# Patient Record
Sex: Female | Born: 1990 | Race: White | Hispanic: No | Marital: Married | Smoking: Never smoker
Health system: Southern US, Community
[De-identification: ages and names within clinical notes are randomized; demographics above are authoritative.]

## PROBLEM LIST (undated history)

## (undated) DIAGNOSIS — T7840XA Allergy, unspecified, initial encounter: Secondary | ICD-10-CM

## (undated) DIAGNOSIS — F32A Depression, unspecified: Secondary | ICD-10-CM

## (undated) DIAGNOSIS — E282 Polycystic ovarian syndrome: Secondary | ICD-10-CM

## (undated) DIAGNOSIS — F329 Major depressive disorder, single episode, unspecified: Secondary | ICD-10-CM

## (undated) HISTORY — DX: Depression, unspecified: F32.A

## (undated) HISTORY — DX: Polycystic ovarian syndrome: E28.2

## (undated) HISTORY — DX: Allergy, unspecified, initial encounter: T78.40XA

## (undated) HISTORY — PX: NASAL SINUS SURGERY: SHX719

---
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
## 2012-10-31 ENCOUNTER — Encounter (HOSPITAL_BASED_OUTPATIENT_CLINIC_OR_DEPARTMENT_OTHER): Payer: Self-pay | Admitting: Emergency Medicine

## 2012-10-31 ENCOUNTER — Emergency Department (HOSPITAL_BASED_OUTPATIENT_CLINIC_OR_DEPARTMENT_OTHER)
Admission: EM | Admit: 2012-10-31 | Discharge: 2012-10-31 | Disposition: A | Discharge status: Home / Self Care | Payer: Managed Care, Other (non HMO) | Attending: Emergency Medicine | Admitting: Emergency Medicine

## 2012-10-31 DIAGNOSIS — R51 Headache: Secondary | ICD-10-CM | POA: Insufficient documentation

## 2012-10-31 DIAGNOSIS — Z792 Long term (current) use of antibiotics: Secondary | ICD-10-CM | POA: Insufficient documentation

## 2012-10-31 DIAGNOSIS — Z79899 Other long term (current) drug therapy: Secondary | ICD-10-CM | POA: Insufficient documentation

## 2012-10-31 DIAGNOSIS — R519 Headache, unspecified: Secondary | ICD-10-CM

## 2012-10-31 DIAGNOSIS — J3489 Other specified disorders of nose and nasal sinuses: Secondary | ICD-10-CM | POA: Insufficient documentation

## 2012-10-31 DIAGNOSIS — M79609 Pain in unspecified limb: Secondary | ICD-10-CM | POA: Insufficient documentation

## 2012-10-31 DIAGNOSIS — H53149 Visual discomfort, unspecified: Secondary | ICD-10-CM | POA: Insufficient documentation

## 2012-10-31 DIAGNOSIS — M542 Cervicalgia: Secondary | ICD-10-CM | POA: Insufficient documentation

## 2012-10-31 MED ORDER — MOMETASONE FUROATE 50 MCG/ACT NA SUSP: 2.0000 | Freq: Every day | NASAL | Status: DC

## 2012-10-31 MED ORDER — DIPHENHYDRAMINE HCL 25 MG PO CAPS
25.0000 mg | ORAL_CAPSULE | Freq: Once | ORAL | Status: AC
  Administered 2012-10-31: 25 mg via ORAL
  Filled 2012-10-31: qty 1

## 2012-10-31 MED ORDER — OXYMETAZOLINE HCL 0.05 % NA SOLN: 2.0000 | Freq: Two times a day (BID) | NASAL | Status: DC

## 2012-10-31 MED ORDER — IBUPROFEN 400 MG PO TABS
400.0000 mg | ORAL_TABLET | Freq: Four times a day (QID) | ORAL | Status: DC | PRN
Start: 2012-10-31 — End: 2013-10-29

## 2012-10-31 MED ORDER — METOCLOPRAMIDE HCL 10 MG PO TABS
10.0000 mg | ORAL_TABLET | Freq: Once | ORAL | Status: AC
  Administered 2012-10-31: 10 mg via ORAL
  Filled 2012-10-31: qty 1

## 2012-10-31 MED ORDER — KETOROLAC TROMETHAMINE 60 MG/2ML IM SOLN
60.0000 mg | Freq: Once | INTRAMUSCULAR | Status: AC
  Administered 2012-10-31: 60 mg via INTRAMUSCULAR
  Filled 2012-10-31: qty 2

## 2012-10-31 MED ORDER — METOCLOPRAMIDE HCL 10 MG PO TABS: 10.0000 mg | ORAL_TABLET | Freq: Four times a day (QID) | ORAL | Status: DC | PRN

## 2012-10-31 NOTE — ED Notes (Signed)
Pt reports she is currently taking abx for sinus infection. Today is last day of abx.

## 2012-10-31 NOTE — ED Provider Notes (Signed)
CSN: [id number]     Arrival date & time 10/31/12  2029 History   First MD Initiated Contact with Patient 10/31/12 2051     Chief Complaint  Patient presents with  . Headache   (Consider location/radiation/quality/duration/timing/severity/associated sxs/prior Treatment) HPI Patient presents with gradual onset throbbing frontal headache that started this afternoon while she was at work. Patient just finished a course of antibiotics for sinusitis. She continues to have congestion and sinus pressure. Patient also acknowledges photophobia. She complains of shooting pain in her right arm and leg. She denies weakness or numbness. She has no history of trauma. Patient states she's had multiple headaches similar to this in the past. She is currently on her period. Past Medical History  Diagnosis Date  . Acute sinus infection    History reviewed. No pertinent past surgical history. No family history on file. History  Substance Use Topics  . Smoking status: Never Smoker   . Smokeless tobacco: Not on file  . Alcohol Use: No   OB History   Grav Para Term Preterm Abortions TAB SAB Ect Mult Living                 Review of Systems  Constitutional: Negative for fever and chills.  HENT: Positive for congestion, neck pain and sinus pressure. Negative for ear pain, rhinorrhea and neck stiffness.   Eyes: Positive for photophobia. Negative for pain and visual disturbance.  Respiratory: Negative for shortness of breath and wheezing.   Cardiovascular: Negative for chest pain, palpitations and leg swelling.  Gastrointestinal: Negative for nausea, vomiting, abdominal pain, diarrhea and constipation.  Musculoskeletal: Positive for myalgias. Negative for back pain.  Skin: Negative for rash and wound.  Neurological: Positive for headaches. Negative for dizziness, syncope, weakness, light-headedness and numbness.  All other systems reviewed and are negative.    Allergies  Review of patient's allergies  indicates no known allergies.  Home Medications   Current Outpatient Rx  Name  Route  Sig  Dispense  Refill  . amoxicillin (AMOXIL) 500 MG tablet   Oral   Take 500 mg by mouth 2 (two) times daily.         . drospirenone-ethinyl estradiol (YASMIN,ZARAH,SYEDA) 3-0.03 MG tablet   Oral   Take 1 tablet by mouth daily.         . Prenatal Vit-Fe Fumarate-FA (PRENATAL MULTIVITAMIN) TABS tablet   Oral   Take 1 tablet by mouth daily at 12 noon.          BP 137/76  Pulse 94  Temp(Src) 98.4 F (36.9 C) (Oral)  Resp 18  Ht 5\' 6"  (1.676 m)  Wt 164 lb (74.39 kg)  BMI 26.48 kg/m2  SpO2 100% Physical Exam  Nursing note and vitals reviewed. Constitutional: She is oriented to person, place, and time. She appears well-developed and well-nourished. No distress.  HENT:  Head: Normocephalic and atraumatic.  Mouth/Throat: Oropharynx is clear and moist. No oropharyngeal exudate.  Patient has tenderness to palpation over bilateral frontal sinuses. Patient also has bilateral edematous turbinates.  Eyes: EOM are normal. Pupils are equal, round, and reactive to light.  Neck: Normal range of motion. Neck supple.  No stiffness or evidence of meningismus. Patient has mild tenderness to palpation over her left cervical paraspinal muscles and left trapezius.  Cardiovascular: Normal rate and regular rhythm.   Pulmonary/Chest: Effort normal and breath sounds normal. No respiratory distress. She has no wheezes. She has no rales.  Abdominal: Soft. Bowel sounds are normal. She exhibits  no distension and no mass. There is no tenderness. There is no rebound and no guarding.  Musculoskeletal: Normal range of motion. She exhibits no edema and no tenderness.  L. tenderness to palpation over her medial surface of her entire left arm and left leg. There is no evidence of any infection, swelling, trauma or deformity.  Neurological: She is alert and oriented to person, place, and time.  Patient is alert and  oriented x3 with clear, goal oriented speech. Patient has 5/5 motor in all extremities. Sensation is intact to light touch. Bilateral finger-to-nose is normal with no signs of dysmetria. Patient has a normal gait and walks without assistance.   Skin: Skin is warm and dry. No rash noted. No erythema.  Psychiatric: She has a normal mood and affect. Her behavior is normal.    ED Course  Procedures (including critical care time) Labs Review Labs Reviewed - No data to display Imaging Review No results found.  MDM  Patient presents with a benign appearing headache. she has no concerning features for more serious cause. Possible combination of ongoing sinus infection, migraine or tension type headache. We'll treat him emergency department and discharged with detailed followup instructions.  Patient states that her headache is significantly improved. She remained stable in the emergency department. Return precautions given.  Loren Racer, MD 10/31/12 2206

## 2012-10-31 NOTE — ED Notes (Signed)
Pt c/o headache x 2 days with photophobia. Pt also c/o shooting pain in left arm and leg. Pt has had headaches like this previous but not dx with migraines

## 2013-07-16 ENCOUNTER — Emergency Department (INDEPENDENT_AMBULATORY_CARE_PROVIDER_SITE_OTHER)
Admission: EM | Admit: 2013-07-16 | Discharge: 2013-07-16 | Disposition: A | Discharge status: Home / Self Care | Payer: Managed Care, Other (non HMO) | Source: Home / Self Care | Attending: Family Medicine | Admitting: Family Medicine

## 2013-07-16 ENCOUNTER — Encounter: Payer: Self-pay | Admitting: Emergency Medicine

## 2013-07-16 DIAGNOSIS — R590 Localized enlarged lymph nodes: Secondary | ICD-10-CM

## 2013-07-16 DIAGNOSIS — R599 Enlarged lymph nodes, unspecified: Secondary | ICD-10-CM

## 2013-07-16 LAB — POCT CBC W AUTO DIFF (K'VILLE URGENT CARE)

## 2013-07-16 NOTE — Discharge Instructions (Signed)
Lymphadenopathy °Lymphadenopathy means "disease of the lymph glands." But the term is usually used to describe swollen or enlarged lymph glands, also called lymph nodes. These are the bean-shaped organs found in many locations including the neck, underarm, and groin. Lymph glands are part of the immune system, which fights infections in your body. Lymphadenopathy can occur in just one area of the body, such as the neck, or it can be generalized, with lymph node enlargement in several areas. The nodes found in the neck are the most common sites of lymphadenopathy. °CAUSES  °When your immune system responds to germs (such as viruses or bacteria ), infection-fighting cells and fluid build up. This causes the glands to grow in size. This is usually not something to worry about. Sometimes, the glands themselves can become infected and inflamed. This is called lymphadenitis. °Enlarged lymph nodes can be caused by many diseases: °· Bacterial disease, such as strep throat or a skin infection. °· Viral disease, such as a common cold. °· Other germs, such as lyme disease, tuberculosis, or sexually transmitted diseases. °· Cancers, such as lymphoma (cancer of the lymphatic system) or leukemia (cancer of the white blood cells). °· Inflammatory diseases such as lupus or rheumatoid arthritis. °· Reactions to medications. °Many of the diseases above are rare, but important. This is why you should see your caregiver if you have lymphadenopathy. °SYMPTOMS  °· Swollen, enlarged lumps in the neck, back of the head or other locations. °· Tenderness. °· Warmth or redness of the skin over the lymph nodes. °· Fever. °DIAGNOSIS  °Enlarged lymph nodes are often near the source of infection. They can help healthcare providers diagnose your illness. For instance:  °· Swollen lymph nodes around the jaw might be caused by an infection in the mouth. °· Enlarged glands in the neck often signal a throat infection. °· Lymph nodes that are swollen  in more than one area often indicate an illness caused by a virus. °Your caregiver most likely will know what is causing your lymphadenopathy after listening to your history and examining you. Blood tests, x-rays or other tests may be needed. If the cause of the enlarged lymph node cannot be found, and it does not go away by itself, then a biopsy may be needed. Your caregiver will discuss this with you. °TREATMENT  °Treatment for your enlarged lymph nodes will depend on the cause. Many times the nodes will shrink to normal size by themselves, with no treatment. Antibiotics or other medicines may be needed for infection. Only take over-the-counter or prescription medicines for pain, discomfort or fever as directed by your caregiver. °HOME CARE INSTRUCTIONS  °Swollen lymph glands usually return to normal when the underlying medical condition goes away. If they persist, contact your health-care provider. He/she might prescribe antibiotics or other treatments, depending on the diagnosis. Take any medications exactly as prescribed. Keep any follow-up appointments made to check on the condition of your enlarged nodes.  °SEEK MEDICAL CARE IF:  °· Swelling lasts for more than two weeks. °· You have symptoms such as weight loss, night sweats, fatigue or fever that does not go away. °· The lymph nodes are hard, seem fixed to the skin or are growing rapidly. °· Skin over the lymph nodes is red and inflamed. This could mean there is an infection. °SEEK IMMEDIATE MEDICAL CARE IF:  °· Fluid starts leaking from the area of the enlarged lymph node. °· You develop a fever of 102° F (38.9° C) or greater. °· Severe   pain develops (not necessarily at the site of a large lymph node). °· You develop chest pain or shortness of breath. °· You develop worsening abdominal pain. °MAKE SURE YOU:  °· Understand these instructions. °· Will watch your condition. °· Will get help right away if you are not doing well or get worse. °Document  Released: 11/16/2007 Document Revised: 05/01/2011 Document Reviewed: 11/16/2007 °[employer]® Patient Information ©2014 [employer]. ° °

## 2013-07-16 NOTE — ED Notes (Signed)
Pt c/o 2 painful lumps behind her RT ear x 5 days. Denies ear pain or fever.

## 2013-07-16 NOTE — ED Provider Notes (Signed)
CSN: [id number]     Arrival date & time 07/16/13  1908 History   First MD Initiated Contact with Patient 07/16/13 1928     Chief Complaint  Patient presents with  . Mass      HPI Comments: Patient noticed a tender lump behind her right ear about 4 to 5 days ago, and later a second smaller lump appeared.  She denies lesions in her scalp.  She feels well otherwise.  No fevers, chills, and sweats.  She is [redacted] weeks pregnant.  The history is provided by the patient.    Past Medical History  Diagnosis Date  . Acute sinus infection    History reviewed. No pertinent past surgical history. Family History  Problem Relation Age of Onset  . Diabetes Mother   . Hypertension Mother   . Aneurysm Mother   . Cancer Father     skin  . Heart failure Father    History  Substance Use Topics  . Smoking status: Never Smoker   . Smokeless tobacco: Not on file  . Alcohol Use: No   OB History   Grav Para Term Preterm Abortions TAB SAB Ect Mult Living   1              Review of Systems  Constitutional: Negative for fever, chills, activity change, appetite change and fatigue.  HENT: Negative for congestion, dental problem, ear discharge, ear pain, facial swelling, mouth sores, rhinorrhea, sinus pressure, sore throat and trouble swallowing.   Eyes: Negative.   Respiratory: Negative.   Cardiovascular: Negative.   Gastrointestinal: Negative.   Genitourinary: Negative.   Musculoskeletal: Negative.   Neurological: Negative for dizziness and headaches.    Allergies  Review of patient's allergies indicates no known allergies.  Home Medications   Prior to Admission medications   Medication Sig Start Date End Date Taking? Authorizing Provider  amoxicillin (AMOXIL) 500 MG tablet Take 500 mg by mouth 2 (two) times daily.    Historical Provider, MD  drospirenone-ethinyl estradiol (YASMIN,ZARAH,SYEDA) 3-0.03 MG tablet Take 1 tablet by mouth daily.    Historical Provider, MD  ibuprofen (ADVIL,MOTRIN)  400 MG tablet Take 1 tablet (400 mg total) by mouth every 6 (six) hours as needed for pain. 10/31/12   Loren Racer, MD  metoCLOPramide (REGLAN) 10 MG tablet Take 1 tablet (10 mg total) by mouth every 6 (six) hours as needed (nausea/headache). 10/31/12   Loren Racer, MD  mometasone (NASONEX) 50 MCG/ACT nasal spray Place 2 sprays into the nose daily. 10/31/12   Loren Racer, MD  oxymetazoline (AFRIN NASAL SPRAY) 0.05 % nasal spray Place 2 sprays into the nose 2 (two) times daily. 10/31/12   Loren Racer, MD  Prenatal Vit-Fe Fumarate-FA (PRENATAL MULTIVITAMIN) TABS tablet Take 1 tablet by mouth daily at 12 noon.    Historical Provider, MD   BP 112/72  Pulse 95  Temp(Src) 98.2 F (36.8 C) (Oral)  Resp 16  Ht 5\' 6"  (1.676 m)  Wt 191 lb (86.637 kg)  BMI 30.84 kg/m2  SpO2 97% Physical Exam  Nursing note and vitals reviewed. Constitutional: She is oriented to person, place, and time. She appears well-developed and well-nourished. No distress.  HENT:  Head: Normocephalic.    Right Ear: External ear and ear canal normal. No drainage, swelling or tenderness.  Left Ear: External ear and ear canal normal. No drainage, swelling or tenderness.  Nose: Nose normal.  Mouth/Throat: Oropharynx is clear and moist.  Behind right ear is a tender enlarged post-auricular  node and a smaller one further posterior as noted on diagram.  Scalp is normal without lesions or tenderness    Eyes: Conjunctivae and EOM are normal. Pupils are equal, round, and reactive to light. Right eye exhibits no discharge. Left eye exhibits no discharge.  Neck: Neck supple.  Cardiovascular: Normal heart sounds.   Pulmonary/Chest: Breath sounds normal.  Abdominal: Soft. There is no tenderness.  Lymphadenopathy:       Head (right side): Posterior auricular and occipital adenopathy present. No preauricular adenopathy present.    She has cervical adenopathy.       Right cervical: Superficial cervical and posterior cervical  adenopathy present.  Neurological: She is alert and oriented to person, place, and time.  Skin: Skin is warm and dry. No rash noted.    ED Course  Procedures  none    Labs Reviewed  POCT CBC W AUTO DIFF ([facility]):  WBC 9.9; LY 10.6; MO 8.8; GR 80.6; Hgb 11.6; Platelets 257       MDM   1. Lymphadenopathy, postauricular ?etiology.  ?early herpes zoster.  Note normal white blood count.   Monitor for now.  Followup with PCP, especially if rash develops    Lattie HawStephen A Elizabet Schweppe, MD 07/16/13 2201

## 2013-10-29 ENCOUNTER — Ambulatory Visit (INDEPENDENT_AMBULATORY_CARE_PROVIDER_SITE_OTHER): Payer: Managed Care, Other (non HMO) | Admitting: Physician Assistant

## 2013-10-29 ENCOUNTER — Encounter: Payer: Self-pay | Admitting: Physician Assistant

## 2013-10-29 VITALS — BP 107/65 | HR 81 | Temp 98.4°F | Ht 66.0 in | Wt 174.0 lb

## 2013-10-29 DIAGNOSIS — J0100 Acute maxillary sinusitis, unspecified: Secondary | ICD-10-CM

## 2013-10-29 MED ORDER — AMOXICILLIN 500 MG PO CAPS: 500.0000 mg | ORAL_CAPSULE | Freq: Two times a day (BID) | ORAL | Status: DC

## 2013-10-29 NOTE — Patient Instructions (Signed)

## 2013-10-30 NOTE — Progress Notes (Signed)
   Subjective:    Patient ID: Melissa Alexander, female    DOB: 17-Sep-1990, 23 y.o.   MRN: [id number]  HPI .Marland KitchenSINUSITIS  Onset: 4 days  Severity: moderate Pt has a 6 month old baby at home  Symptoms Cough: no Runny nose: no Fever: no    Sinus Pressure: yes  Ears Blocked: yes  Teeth Ache: yes  Frontal Headache: yes  Second sickening: no   PMH Sinusitis or Recurrent OM: no  PMH Prior Sinus or Ear Surgery: no  Recent antibiotic usage (last 30 days): no  PMH of Diabetes or Immunocompromise: no    Red flags Change in mental state: no Change in vision: no Rash: no      Review of Systems  All other systems reviewed and are negative.      Objective:   Physical Exam  Constitutional: She is oriented to person, place, and time. She appears well-developed and well-nourished.  HENT:  Head: Normocephalic and atraumatic.  Right Ear: External ear normal.  Left Ear: External ear normal.  TM's clear bilaterally.  Oropharynx erythematous.  Maxillary tenderness to palpation bilaterally.   Eyes: Conjunctivae are normal. Right eye exhibits no discharge. Left eye exhibits no discharge.  Neck: Normal range of motion. Neck supple.  Bilateral anterior cervical lymphnode swelling.   Cardiovascular: Normal rate, regular rhythm and normal heart sounds.   Pulmonary/Chest: Effort normal and breath sounds normal. She has no wheezes.  Lymphadenopathy:    She has cervical adenopathy.  Neurological: She is alert and oriented to person, place, and time.  Skin: Skin is dry.  Psychiatric: She has a normal mood and affect. Her behavior is normal.          Assessment & Plan:  Acute bacterial sinus infection- treated with amoxil for 10 days. HO given. Encouraged Mucinex D. Flonase.

## 2013-12-05 ENCOUNTER — Emergency Department (INDEPENDENT_AMBULATORY_CARE_PROVIDER_SITE_OTHER)
Admission: EM | Admit: 2013-12-05 | Discharge: 2013-12-05 | Disposition: A | Discharge status: Home / Self Care | Payer: Managed Care, Other (non HMO) | Source: Home / Self Care | Attending: Emergency Medicine | Admitting: Emergency Medicine

## 2013-12-05 ENCOUNTER — Encounter: Payer: Self-pay | Admitting: Emergency Medicine

## 2013-12-05 DIAGNOSIS — J0100 Acute maxillary sinusitis, unspecified: Secondary | ICD-10-CM

## 2013-12-05 MED ORDER — AMOXICILLIN 875 MG PO TABS: ORAL_TABLET | ORAL | Status: DC

## 2013-12-05 MED ORDER — PROMETHAZINE-CODEINE 6.25-10 MG/5ML PO SYRP: ORAL_SOLUTION | ORAL | Status: DC

## 2013-12-05 NOTE — ED Provider Notes (Signed)
CSN: [id number]     Arrival date & time 12/05/13  1128 History   First MD Initiated Contact with Patient 12/05/13 1153     Chief Complaint  Patient presents with  . Cough   (Consider location/radiation/quality/duration/timing/severity/associated sxs/prior Treatment) HPI SINUSITIS  Onset: 5 days Facial/sinus pressure with discolored nasal mucus.    Severity: moderate Tried OTC meds without significant relief.  Symptoms:  + Fever to 101 + URI prodrome with nasal congestion + Minimal swollen neck glands + mild Sinus Headache + mild ear pressure  No Allergy symptoms No significant Sore Throat No eye symptoms     + Cough, occasionally productive of discolored sputum. Cough keeps her up at night. She requests a cough syrup for nighttime No chest pain No shortness of breath  No wheezing  No Abdominal Pain No Nausea No Vomiting No diarrhea  No Myalgias No focal neurologic symptoms No syncope No Rash  No Urinary symptoms          Past Medical History  Diagnosis Date  . Acute sinus infection    History reviewed. No pertinent past surgical history. Family History  Problem Relation Age of Onset  . Diabetes Mother   . Hypertension Mother   . Aneurysm Mother   . Hyperlipidemia Mother   . Cancer Father     skin  . Heart failure Father   . Hyperlipidemia Father   . Hypertension Father   . Cancer Paternal Aunt   . Stroke Maternal Grandmother   . Stroke Paternal Grandfather    History  Substance Use Topics  . Smoking status: Never Smoker   . Smokeless tobacco: Not on file  . Alcohol Use: No   OB History   Grav Para Term Preterm Abortions TAB SAB Ect Mult Living   1              Review of Systems  All other systems reviewed and are negative.   Allergies  Review of patient's allergies indicates no known allergies.  Home Medications   Prior to Admission medications   Medication Sig Start Date End Date Taking? Authorizing Provider   drospirenone-ethinyl estradiol (YASMIN,ZARAH,SYEDA) 3-0.03 MG tablet Take 1 tablet by mouth daily.   Yes Historical Provider, MD  amoxicillin (AMOXIL) 875 MG tablet Take 1 twice a day X 10 days. 12/05/13   Lajean Manesavid Massey, MD  escitalopram (LEXAPRO) 20 MG tablet Take 20 mg by mouth daily.    Historical Provider, MD  promethazine-codeine (PHENERGAN WITH CODEINE) 6.25-10 MG/5ML syrup Take 1-2 teaspoons every 4-6 hours as needed for cough. May cause drowsiness. 12/05/13   Lajean Manesavid Massey, MD   BP 113/72  Pulse 90  Temp(Src) 98.2 F (36.8 C) (Oral)  Resp 16  Ht 5\' 6"  (1.676 m)  Wt 169 lb (76.658 kg)  BMI 27.29 kg/m2  SpO2 96%  LMP 11/04/2013  Breastfeeding? No Physical Exam  Nursing note and vitals reviewed. Constitutional: She is oriented to person, place, and time. She appears well-developed and well-nourished. No distress.  HENT:  Head: Normocephalic and atraumatic.  Right Ear: Tympanic membrane, external ear and ear canal normal.  Left Ear: Tympanic membrane, external ear and ear canal normal.  Nose: Mucosal edema and rhinorrhea present. Right sinus exhibits maxillary sinus tenderness. Left sinus exhibits maxillary sinus tenderness.  Mouth/Throat: Oropharynx is clear and moist. No oral lesions. No oropharyngeal exudate.  Eyes: Right eye exhibits no discharge. Left eye exhibits no discharge. No scleral icterus.  Neck: Neck supple.  Cardiovascular: Normal rate, regular  rhythm and normal heart sounds.   Pulmonary/Chest: Effort normal and breath sounds normal. She has no wheezes. She has no rales.  Lymphadenopathy:    She has no cervical adenopathy.  Neurological: She is alert and oriented to person, place, and time.  Skin: Skin is warm and dry.    ED Course  Procedures (including critical care time) Labs Review Labs Reviewed - No data to display  Imaging Review No results found.   MDM   1. Acute maxillary sinusitis, recurrence not specified    Amoxicillin Mucinex Other  symptomatic care discussed Small prescription for Phenergan with codeine cough syrup, precautions discussed Follow-up with your primary care doctor in 5-7 days if not improving, or sooner if symptoms become worse. Precautions discussed. Red flags discussed. Questions invited and answered. Patient voiced understanding and agreement.     Lajean Manesavid Massey, MD 12/05/13 [phone]

## 2013-12-05 NOTE — ED Notes (Signed)
Pt c/p productive cough with green bloody sputum x 5 days. Low grade fever of 101 at night. Chills during the day. No flu vac this year.

## 2013-12-08 ENCOUNTER — Encounter: Payer: Self-pay | Admitting: Physician Assistant

## 2013-12-08 ENCOUNTER — Ambulatory Visit (INDEPENDENT_AMBULATORY_CARE_PROVIDER_SITE_OTHER): Payer: Managed Care, Other (non HMO) | Admitting: Physician Assistant

## 2013-12-08 VITALS — BP 114/58 | HR 99 | Ht 66.0 in | Wt 173.0 lb

## 2013-12-08 DIAGNOSIS — J069 Acute upper respiratory infection, unspecified: Secondary | ICD-10-CM

## 2013-12-08 DIAGNOSIS — J208 Acute bronchitis due to other specified organisms: Secondary | ICD-10-CM

## 2013-12-08 MED ORDER — HYDROCOD POLST-CHLORPHEN POLST 10-8 MG/5ML PO LQCR: 5.0000 mL | Freq: Two times a day (BID) | ORAL | Status: DC | PRN

## 2013-12-08 MED ORDER — PREDNISONE 50 MG PO TABS: ORAL_TABLET | ORAL | Status: DC

## 2013-12-08 NOTE — Progress Notes (Signed)
   Subjective:    Patient ID: Melissa Alexander, female    DOB: [birth year], 23 y.o.   MRN: [id number]  HPI Pt presents to the clinic to follow up after UC visit on Friday. She saw Dr. Georgina PillionMassey and given amoxicillin and cough syrup. Symptoms started last Monday. Cough is very productive and seems to be worse. Cough syrup not helping. No fever. Daughter is also sick. Some wheezing last night. No had CXR.  She has been also taking OTC meds to help but nothing seems to be helping.    Review of Systems  All other systems reviewed and are negative.      Objective:   Physical Exam  Constitutional: She is oriented to person, place, and time. She appears well-developed and well-nourished.  HENT:  Head: Normocephalic and atraumatic.  Right Ear: External ear normal.  Left Ear: External ear normal.  TM's clear.  Oropharynx erythematous with no tonsilar swelling or exudate.  Negative maxillary pressure to palpation bilaterally.  Bilateral nares red and swollen.   Eyes: Conjunctivae are normal. Right eye exhibits no discharge. Left eye exhibits no discharge.  Neck: Normal range of motion. Neck supple.  Cardiovascular: Normal rate, regular rhythm and normal heart sounds.   Pulmonary/Chest: Effort normal.  Bilateral lung wheezing heard when asked to cough.    Lymphadenopathy:    She has no cervical adenopathy.  Neurological: She is alert and oriented to person, place, and time.  Skin: Skin is dry.  Psychiatric: She has a normal mood and affect. Her behavior is normal.          Assessment & Plan:  Acute bronchitis/ Acute URI- continue on amoxillicin. Try tussinonex for cough. Added prednisone for 5 days. Follow up if not improving and get CXR.

## 2013-12-22 ENCOUNTER — Encounter: Payer: Self-pay | Admitting: Physician Assistant

## 2014-01-02 ENCOUNTER — Ambulatory Visit (INDEPENDENT_AMBULATORY_CARE_PROVIDER_SITE_OTHER): Payer: Managed Care, Other (non HMO)

## 2014-01-02 ENCOUNTER — Ambulatory Visit (INDEPENDENT_AMBULATORY_CARE_PROVIDER_SITE_OTHER): Payer: Managed Care, Other (non HMO) | Admitting: Sports Medicine

## 2014-01-02 ENCOUNTER — Encounter: Payer: Self-pay | Admitting: Sports Medicine

## 2014-01-02 VITALS — BP 112/65 | HR 92 | Ht 67.0 in | Wt 168.0 lb

## 2014-01-02 DIAGNOSIS — R509 Fever, unspecified: Secondary | ICD-10-CM

## 2014-01-02 DIAGNOSIS — R05 Cough: Secondary | ICD-10-CM

## 2014-01-02 DIAGNOSIS — R059 Cough, unspecified: Secondary | ICD-10-CM | POA: Insufficient documentation

## 2014-01-02 MED ORDER — BENZONATATE 200 MG PO CAPS: 200.0000 mg | ORAL_CAPSULE | Freq: Three times a day (TID) | ORAL | Status: DC | PRN

## 2014-01-02 MED ORDER — PREDNISONE (PAK) 10 MG PO TABS: ORAL_TABLET | ORAL | Status: DC

## 2014-01-02 MED ORDER — AZITHROMYCIN 250 MG PO TABS: ORAL_TABLET | ORAL | Status: DC

## 2014-01-02 NOTE — Assessment & Plan Note (Signed)
Persistent now for 2 months despite amoxicillin, and Tussionex, and prednisone. Chest x-ray, Tessalon Perles, azithromycin, prednisone taper. Return in one week, if no better she will probably need lung function testing plus/minus cross-sectional imaging.

## 2014-01-02 NOTE — Progress Notes (Signed)
  Subjective:    CC: coughing  HPI: For 2 months now this pleasant 23 year old female has had a persistent cough, on and off, productive of greenish pedal, fevers, and chills at night. They do have a newborn, who goes to daycare. She and her husband have had similar symptoms. Moderate, persistent, no leg swelling, no chest pain, no shortness of breath.no skin rashes, no GI symptoms.  Past medical history, Surgical history, Family history not pertinant except as noted below, Social history, Allergies, and medications have been entered into the medical record, reviewed, and no changes needed.   Review of Systems: No fevers, chills, night sweats, weight loss, chest pain, or shortness of breath.   Objective:    General: Well Developed, well nourished, and in no acute distress.  Neuro: Alert and oriented x3, extra-ocular muscles intact, sensation grossly intact.  HEENT: Normocephalic, atraumatic, pupils equal round reactive to light, neck supple, no masses, no lymphadenopathy, thyroid nonpalpable.  Skin: Warm and dry, no rashes. Cardiac: Regular rate and rhythm, no murmurs rubs or gallops, no lower extremity edema.  Respiratory: Clear to auscultation bilaterally. Not using accessory muscles, speaking in full sentences.  Impression and Recommendations:

## 2014-01-14 ENCOUNTER — Ambulatory Visit: Payer: Managed Care, Other (non HMO) | Admitting: Physician Assistant

## 2014-03-06 ENCOUNTER — Encounter: Payer: Self-pay | Admitting: *Deleted

## 2014-03-06 ENCOUNTER — Emergency Department
Admission: EM | Admit: 2014-03-06 | Discharge: 2014-03-06 | Disposition: A | Discharge status: Home / Self Care | Payer: Self-pay | Source: Home / Self Care | Attending: Emergency Medicine | Admitting: Emergency Medicine

## 2014-03-06 DIAGNOSIS — J0101 Acute recurrent maxillary sinusitis: Secondary | ICD-10-CM

## 2014-03-06 DIAGNOSIS — J209 Acute bronchitis, unspecified: Secondary | ICD-10-CM

## 2014-03-06 LAB — POCT INFLUENZA A/B
Influenza A, POC: NEGATIVE
Influenza B, POC: NEGATIVE

## 2014-03-06 LAB — POCT RAPID STREP A (OFFICE): Rapid Strep A Screen: NEGATIVE

## 2014-03-06 MED ORDER — CEFDINIR 300 MG PO CAPS: 300.0000 mg | ORAL_CAPSULE | Freq: Two times a day (BID) | ORAL | Status: DC

## 2014-03-06 MED ORDER — PROMETHAZINE-CODEINE 6.25-10 MG/5ML PO SYRP: ORAL_SOLUTION | ORAL | Status: DC

## 2014-03-06 NOTE — ED Provider Notes (Signed)
CSN: [id number]     Arrival date & time 03/06/14  1050 History   First MD Initiated Contact with Patient 03/06/14 1140     Chief Complaint  Patient presents with  . Cough  . Generalized Body Aches  . Nasal Congestion  . Sore Throat   (Consider location/radiation/quality/duration/timing/severity/associated sxs/prior Treatment) HPI URI HISTORY  Melissa Alexander is a 24 y.o. female who complains of progressively worsening cough, nasal congestion, fever, headache, sore throat, for 5 days.  Have been using over-the-counter treatment which helps a little bit.  Mild chills/sweats +  Fever  +  Nasal congestion +  Discolored Post-nasal drainage Positive sinus pain/pressure Positive sore throat  +  Cough, productive of discolored sputum  No wheezing Mild chest congestion No hemoptysis No shortness of breath No pleuritic pain  No itchy/red eyes No earache  No nausea No vomiting No abdominal pain No diarrhea  No skin rashes +  Fatigue No myalgias No headache   Past Medical History  Diagnosis Date  . Acute sinus infection    History reviewed. No pertinent past surgical history. Family History  Problem Relation Age of Onset  . Diabetes Mother   . Hypertension Mother   . Aneurysm Mother   . Hyperlipidemia Mother   . Cancer Father     skin  . Heart failure Father   . Hyperlipidemia Father   . Hypertension Father   . Cancer Paternal Aunt   . Stroke Maternal Grandmother   . Stroke Paternal Grandfather    History  Substance Use Topics  . Smoking status: Never Smoker   . Smokeless tobacco: Not on file  . Alcohol Use: No   OB History    Gravida Para Term Preterm AB TAB SAB Ectopic Multiple Living   1              Review of Systems  All other systems reviewed and are negative.   Allergies  Review of patient's allergies indicates no known allergies.  Home Medications   Prior to Admission medications   Medication Sig Start Date End Date Taking? Authorizing Provider   cefdinir (OMNICEF) 300 MG capsule Take 1 capsule (300 mg total) by mouth 2 (two) times daily. X 10 days 03/06/14   Lajean Manes, MD  drospirenone-ethinyl estradiol (YASMIN,ZARAH,SYEDA) 3-0.03 MG tablet Take 1 tablet by mouth daily.    Historical Provider, MD  promethazine-codeine (PHENERGAN WITH CODEINE) 6.25-10 MG/5ML syrup Take 1-2 teaspoons every 4-6 hours as needed for cough. May cause drowsiness. 03/06/14   Lajean Manes, MD   BP 117/80 mmHg  Pulse 110  Temp(Src) 99.6 F (37.6 C) (Oral)  Resp 18  Ht  (1.676 m)  Wt 167 lb (75.751 kg)  BMI 26.97 kg/m2  SpO2 98%  LMP 03/04/2014 Physical Exam  Constitutional: She is oriented to person, place, and time. She appears well-developed and well-nourished. No distress.  HENT:  Head: Normocephalic and atraumatic.  Right Ear: Tympanic membrane, external ear and ear canal normal.  Left Ear: Tympanic membrane, external ear and ear canal normal.  Nose: Mucosal edema and rhinorrhea present. Right sinus exhibits maxillary sinus tenderness. Left sinus exhibits maxillary sinus tenderness.  Mouth/Throat: No oral lesions. Posterior oropharyngeal erythema present. No oropharyngeal exudate, posterior oropharyngeal edema or tonsillar abscesses.  Eyes: Right eye exhibits no discharge. Left eye exhibits no discharge. No scleral icterus.  Neck: Neck supple.  Cardiovascular: Normal rate, regular rhythm and normal heart sounds.   Pulmonary/Chest: Effort normal. She has no wheezes. She has rhonchi.  She has no rales.  Lymphadenopathy:    She has no cervical adenopathy.  Neurological: She is alert and oriented to person, place, and time.  Skin: Skin is warm and dry.  Psychiatric: She has a normal mood and affect.  Nursing note and vitals reviewed.   ED Course  Procedures (including critical care time) Labs Review Labs Reviewed  POCT RAPID STREP A (OFFICE)  POCT INFLUENZA A/B   Results for orders placed or performed during the hospital encounter of  03/06/14  POCT rapid strep A  Result Value Ref Range   Rapid Strep A Screen Negative Negative  POCT Influenza A/B  Result Value Ref Range   Influenza A, POC Negative    Influenza B, POC Negative      Imaging Review No results found.   MDM   1. Acute recurrent maxillary sinusitis   2. Acute bronchitis, unspecified organism    3. Pharyngitis  Treatment options discussed, as well as risks, benefits, alternatives. Patient voiced understanding and agreement with the following plans: Discharge Medication List as of 03/06/2014 11:44 AM    START taking these medications   Details  cefdinir (OMNICEF) 300 MG capsule Take 1 capsule (300 mg total) by mouth 2 (two) times daily. X 10 days, Starting 03/06/2014, Until Discontinued, Print    promethazine-codeine (PHENERGAN WITH CODEINE) 6.25-10 MG/5ML syrup Take 1-2 teaspoons every 4-6 hours as needed for cough. May cause drowsiness., Print       Other symptomatic care discussed Follow-up with your primary care doctor in 5-7 days if not improving, or sooner if symptoms become worse. Precautions discussed. Red flags discussed. Questions invited and answered. Patient voiced understanding and agreement.     Lajean Manesavid Massey, MD 03/06/14 [phone]

## 2014-03-06 NOTE — ED Notes (Signed)
Pt c/o productive cough, nasal congestion, fever, body aches, chills, HA and sore throat x 5 days.

## 2014-03-11 ENCOUNTER — Telehealth: Payer: Self-pay | Admitting: Emergency Medicine

## 2014-03-11 NOTE — ED Notes (Signed)
callback: tried pt on 1/19 at 3pm at [phone] no answer and no VM set up. tried above number as well as mobile again on 03/11/13 with no answer. pt LM on machine wanting to know what to take for loss of taste and smell. per dr Kandee Keencory this is normal for maxillary sinusitis. Pt to wait and see if sxs resolve and return to office if not. --------------------------------------------lds,cma

## 2014-04-03 ENCOUNTER — Encounter: Payer: Self-pay | Admitting: *Deleted

## 2014-04-03 ENCOUNTER — Emergency Department
Admission: EM | Admit: 2014-04-03 | Discharge: 2014-04-03 | Disposition: A | Discharge status: Home / Self Care | Payer: Self-pay | Source: Home / Self Care | Attending: Emergency Medicine | Admitting: Emergency Medicine

## 2014-04-03 DIAGNOSIS — J039 Acute tonsillitis, unspecified: Secondary | ICD-10-CM

## 2014-04-03 LAB — POCT RAPID STREP A (OFFICE): Rapid Strep A Screen: NEGATIVE

## 2014-04-03 MED ORDER — AMOXICILLIN 875 MG PO TABS: ORAL_TABLET | ORAL | Status: DC

## 2014-04-03 NOTE — ED Provider Notes (Signed)
CSN: [id number]     Arrival date & time 04/03/14  1035 History   First MD Initiated Contact with Patient 04/03/14 1039     Chief Complaint  Patient presents with  . Sore Throat  . Chills   (Consider location/radiation/quality/duration/timing/severity/associated sxs/prior Treatment) HPI SORE THROAT Onset: 4 days    Severity: moderate. Progressively worsening despite OTC meds. Tried OTC meds without significant relief.  Symptoms:  + Fever  + Swollen neck glands No Recent Strep Exposure     No Myalgias Mild, nonfocal Headache No Rash + Mild fatigue  Denies chance of pregnancy. LMP 03/30/2014  No Discolored Nasal Mucus No Allergy symptoms No sinus pain/pressure No itchy/red eyes No earache  No Drooling No Trismus  No Nausea No Vomiting No Abdominal pain No Diarrhea No Reflux symptoms  Rare nonproductive Cough No Breathing Difficulty No Shortness of Breath No pleuritic pain No Wheezing No Hemoptysis   Past Medical History  Diagnosis Date  . Acute sinus infection    History reviewed. No pertinent past surgical history. Family History  Problem Relation Age of Onset  . Diabetes Mother   . Hypertension Mother   . Aneurysm Mother   . Hyperlipidemia Mother   . Cancer Father     skin  . Heart failure Father   . Hyperlipidemia Father   . Hypertension Father   . Cancer Paternal Aunt   . Stroke Maternal Grandmother   . Stroke Paternal Grandfather    History  Substance Use Topics  . Smoking status: Never Smoker   . Smokeless tobacco: Never Used  . Alcohol Use: No   OB History    Gravida Para Term Preterm AB TAB SAB Ectopic Multiple Living   1              Review of Systems  All other systems reviewed and are negative.   Allergies  Review of patient's allergies indicates no known allergies.  Home Medications   Prior to Admission medications   Medication Sig Start Date End Date Taking? Authorizing Provider  amoxicillin (AMOXIL) 875 MG tablet  Take 1 twice a day X 10 days. 04/03/14   Lajean Manesavid Massey, MD  drospirenone-ethinyl estradiol (YASMIN,ZARAH,SYEDA) 3-0.03 MG tablet Take 1 tablet by mouth daily.    Historical Provider, MD   BP 135/81 mmHg  Pulse 122  Temp(Src) 98.2 F (36.8 C) (Oral)  Resp 14  Wt 166 lb (75.297 kg)  SpO2 99%  LMP 03/30/2014 Physical Exam  Constitutional: She is oriented to person, place, and time. She appears well-developed and well-nourished.  Non-toxic appearance. She appears ill. No distress.  Pulse repeated 104, regular  HENT:  Head: Normocephalic and atraumatic.  Right Ear: Tympanic membrane, external ear and ear canal normal.  Left Ear: Tympanic membrane, external ear and ear canal normal.  Nose: Nose normal. Right sinus exhibits no maxillary sinus tenderness and no frontal sinus tenderness. Left sinus exhibits no maxillary sinus tenderness and no frontal sinus tenderness.  Mouth/Throat: Uvula is midline and mucous membranes are normal. No oral lesions. Posterior oropharyngeal erythema present. No oropharyngeal exudate or tonsillar abscesses.  + Bilateral, 2+ Tonsillar enlargement.  Mild whitish gray exudate bilaterally.  Airway intact.  Eyes: Conjunctivae are normal. No scleral icterus.  Neck: Neck supple.  Cardiovascular: Normal rate, regular rhythm and normal heart sounds.   No murmur heard. Pulmonary/Chest: Effort normal and breath sounds normal. No stridor. No respiratory distress. She has no wheezes. She has no rhonchi. She has no rales.  Abdominal: Soft.  She exhibits no mass. There is no hepatosplenomegaly. There is no tenderness.  Lymphadenopathy:    She has cervical adenopathy.       Right cervical: Superficial cervical adenopathy present. No deep cervical and no posterior cervical adenopathy present.      Left cervical: Superficial cervical adenopathy present. No deep cervical and no posterior cervical adenopathy present.  Neurological: She is alert and oriented to person, place, and  time.  Skin: Skin is warm. No rash noted.  Psychiatric: She has a normal mood and affect.  Nursing note and vitals reviewed.   ED Course  Procedures (including critical care time) Labs Review Labs Reviewed  POCT RAPID STREP A (OFFICE)    Imaging Review No results found.   MDM   1. Acute tonsillitis     Rapid strep test negative Treatment options discussed, as well as risks, benefits, alternatives. Patient voiced understanding and agreement with the following plans: Discharge Medication List as of 04/03/2014 11:20 AM    START taking these medications   Details  amoxicillin (AMOXIL) 875 MG tablet Take 1 twice a day X 10 days., Normal       Push clear liquids and advance diet as tolerated. Follow-up with your primary care doctor or ENT in 5-7 days if not improving, or sooner if symptoms become worse. She declined other antibiotic choices. She declined empiric treatment with IM penicillin or Rocephin. She declined option of sending off a strep culture to reference lab. Precautions discussed. Red flags discussed.--Emergency room if any red flag Questions invited and answered. Patient voiced understanding and agreement.   Lajean Manes, MD 04/03/14 [phone]

## 2014-04-03 NOTE — ED Notes (Signed)
Pt c/o sore throat, HA, cough x 4 days. CHills and sweats since last night.

## 2014-04-05 ENCOUNTER — Telehealth: Payer: Self-pay

## 2014-04-07 ENCOUNTER — Ambulatory Visit (INDEPENDENT_AMBULATORY_CARE_PROVIDER_SITE_OTHER): Payer: Self-pay | Admitting: Physician Assistant

## 2014-04-07 ENCOUNTER — Encounter: Payer: Self-pay | Admitting: Physician Assistant

## 2014-04-07 VITALS — BP 132/80 | HR 112 | Ht 66.0 in | Wt 165.0 lb

## 2014-04-07 DIAGNOSIS — H1089 Other conjunctivitis: Secondary | ICD-10-CM

## 2014-04-07 DIAGNOSIS — K649 Unspecified hemorrhoids: Secondary | ICD-10-CM

## 2014-04-07 DIAGNOSIS — K625 Hemorrhage of anus and rectum: Secondary | ICD-10-CM

## 2014-04-07 DIAGNOSIS — A499 Bacterial infection, unspecified: Secondary | ICD-10-CM

## 2014-04-07 DIAGNOSIS — H109 Unspecified conjunctivitis: Secondary | ICD-10-CM

## 2014-04-07 MED ORDER — AMOXICILLIN-POT CLAVULANATE 875-125 MG PO TABS: 1.0000 | ORAL_TABLET | Freq: Two times a day (BID) | ORAL | Status: DC

## 2014-04-07 MED ORDER — POLYMYXIN B-TRIMETHOPRIM 10000-0.1 UNIT/ML-% OP SOLN: 1.0000 [drp] | Freq: Four times a day (QID) | OPHTHALMIC | Status: DC

## 2014-04-07 MED ORDER — LIDOCAINE-HYDROCORTISONE ACE 3-0.5 % RE CREA: 1.0000 | TOPICAL_CREAM | Freq: Two times a day (BID) | RECTAL | Status: DC

## 2014-04-07 NOTE — Patient Instructions (Addendum)
Bacterial Conjunctivitis Bacterial conjunctivitis, commonly called pink eye, is an inflammation of the clear membrane that covers the white part of the eye (conjunctiva). The inflammation can also happen on the underside of the eyelids. The blood vessels in the conjunctiva become inflamed, causing the eye to become red or pink. Bacterial conjunctivitis may spread easily from one eye to another and from person to person (contagious).  CAUSES  Bacterial conjunctivitis is caused by bacteria. The bacteria may come from your own skin, your upper respiratory tract, or from someone else with bacterial conjunctivitis. SYMPTOMS  The normally white color of the eye or the underside of the eyelid is usually pink or red. The pink eye is usually associated with irritation, tearing, and some sensitivity to light. Bacterial conjunctivitis is often associated with a thick, yellowish discharge from the eye. The discharge may turn into a crust on the eyelids overnight, which causes your eyelids to stick together. If a discharge is present, there may also be some blurred vision in the affected eye. DIAGNOSIS  Bacterial conjunctivitis is diagnosed by your caregiver through an eye exam and the symptoms that you report. Your caregiver looks for changes in the surface tissues of your eyes, which may point to the specific type of conjunctivitis. A sample of any discharge may be collected on a cotton-tip swab if you have a severe case of conjunctivitis, if your cornea is affected, or if you keep getting repeat infections that do not respond to treatment. The sample will be sent to a lab to see if the inflammation is caused by a bacterial infection and to see if the infection will respond to antibiotic medicines. TREATMENT   Bacterial conjunctivitis is treated with antibiotics. Antibiotic eyedrops are most often used. However, antibiotic ointments are also available. Antibiotics pills are sometimes used. Artificial tears or eye  washes may ease discomfort. HOME CARE INSTRUCTIONS   To ease discomfort, apply a cool, clean washcloth to your eye for 10-20 minutes, 3-4 times a day.  Gently wipe away any drainage from your eye with a warm, wet washcloth or a cotton ball.  Wash your hands often with soap and water. Use paper towels to dry your hands.  Do not share towels or washcloths. This may spread the infection.  Change or wash your pillowcase every day.  You should not use eye makeup until the infection is gone.  Do not operate machinery or drive if your vision is blurred.  Stop using contact lenses. Ask your caregiver how to sterilize or replace your contacts before using them again. This depends on the type of contact lenses that you use.  When applying medicine to the infected eye, do not touch the edge of your eyelid with the eyedrop bottle or ointment tube. SEEK IMMEDIATE MEDICAL CARE IF:   Your infection has not improved within 3 days after beginning treatment.  You had yellow discharge from your eye and it returns.  You have increased eye pain.  Your eye redness is spreading.  Your vision becomes blurred.  You have a fever or persistent symptoms for more than 2-3 days.  You have a fever and your symptoms suddenly get worse.  You have facial pain, redness, or swelling. MAKE SURE YOU:   Understand these instructions.  Will watch your condition.  Will get help right away if you are not doing well or get worse. Document Released: 02/06/2005 Document Revised: 06/23/2013 Document Reviewed: 07/10/2011 [employer] Patient Information 2015 [employer]. This information is not intended to   replace advice given to you by your health care provider. Make sure you discuss any questions you have with your health care provider.   Hemorrhoids Miralax 1 capful at bedtime.   Hemorrhoids are swollen veins around the rectum or anus. There are two types of hemorrhoids:   Internal hemorrhoids. These occur  in the veins just inside the rectum. They may poke through to the outside and become irritated and painful.  External hemorrhoids. These occur in the veins outside the anus and can be felt as a painful swelling or hard lump near the anus. CAUSES  Pregnancy.   Obesity.   Constipation or diarrhea.   Straining to have a bowel movement.   Sitting for long periods on the toilet.  Heavy lifting or other activity that caused you to strain.  Anal intercourse. SYMPTOMS   Pain.   Anal itching or irritation.   Rectal bleeding.   Fecal leakage.   Anal swelling.   One or more lumps around the anus.  DIAGNOSIS  Your caregiver may be able to diagnose hemorrhoids by visual examination. Other examinations or tests that may be performed include:   Examination of the rectal area with a gloved hand (digital rectal exam).   Examination of anal canal using a small tube (scope).   A blood test if you have lost a significant amount of blood.  A test to look inside the colon (sigmoidoscopy or colonoscopy). TREATMENT Most hemorrhoids can be treated at home. However, if symptoms do not seem to be getting better or if you have a lot of rectal bleeding, your caregiver may perform a procedure to help make the hemorrhoids get smaller or remove them completely. Possible treatments include:   Placing a rubber band at the base of the hemorrhoid to cut off the circulation (rubber band ligation).   Injecting a chemical to shrink the hemorrhoid (sclerotherapy).   Using a tool to burn the hemorrhoid (infrared light therapy).   Surgically removing the hemorrhoid (hemorrhoidectomy).   Stapling the hemorrhoid to block blood flow to the tissue (hemorrhoid stapling).  HOME CARE INSTRUCTIONS   Eat foods with fiber, such as whole grains, beans, nuts, fruits, and vegetables. Ask your doctor about taking products with added fiber in them (fibersupplements).  Increase fluid intake. Drink  enough water and fluids to keep your urine clear or pale yellow.   Exercise regularly.   Go to the bathroom when you have the urge to have a bowel movement. Do not wait.   Avoid straining to have bowel movements.   Keep the anal area dry and clean. Use wet toilet paper or moist towelettes after a bowel movement.   Medicated creams and suppositories may be used or applied as directed.   Only take over-the-counter or prescription medicines as directed by your caregiver.   Take warm sitz baths for 15-20 minutes, 3-4 times a day to ease pain and discomfort.   Place ice packs on the hemorrhoids if they are tender and swollen. Using ice packs between sitz baths may be helpful.   Put ice in a plastic bag.   Place a towel between your skin and the bag.   Leave the ice on for 15-20 minutes, 3-4 times a day.   Do not use a donut-shaped pillow or sit on the toilet for long periods. This increases blood pooling and pain.  SEEK MEDICAL CARE IF:  You have increasing pain and swelling that is not controlled by treatment or medicine.  You have uncontrolled  bleeding.  You have difficulty or you are unable to have a bowel movement.  You have pain or inflammation outside the area of the hemorrhoids. MAKE SURE YOU:  Understand these instructions.  Will watch your condition.  Will get help right away if you are not doing well or get worse. Document Released: 02/04/2000 Document Revised: 01/24/2012 Document Reviewed: 12/12/2011 [facility] Patient Information 2015 [place]. This information is not intended to replace advice given to you by your health care provider. Make sure you discuss any questions you have with your health care provider.

## 2014-04-08 DIAGNOSIS — K649 Unspecified hemorrhoids: Secondary | ICD-10-CM | POA: Insufficient documentation

## 2014-04-08 DIAGNOSIS — K625 Hemorrhage of anus and rectum: Secondary | ICD-10-CM | POA: Insufficient documentation

## 2014-04-08 NOTE — Progress Notes (Signed)
   Subjective:    Patient ID: Melissa Alexander, female    DOB: 12-21-1990, 24 y.o.   MRN: [id number]  HPI Pt presents to the clinic with left eye redness and discharge that started this morning. On amoxil for sinusitis. Does feel some better but not like she usually does on abx. Dr. Georgina PillionMassey mention switching abx but she decided to follow up with us. No fever. Very congested with some sinus pressure. Eye is tender with discomfort. No itching.   She had a baby in novemeber and since has had more painful, hard bowel movements. There is occasionally some bright red blood in stool. Tried some stool softeners occasionally but nothing regularly. No abdominal pain.   Review of Systems  All other systems reviewed and are negative.      Objective:   Physical Exam  Constitutional: She is oriented to person, place, and time. She appears well-developed and well-nourished.  HENT:  Head: Normocephalic and atraumatic.  Right Ear: External ear normal.  Left Ear: External ear normal.  Nose: Nose normal.  Mouth/Throat: Oropharynx is clear and moist. No oropharyngeal exudate.  Eyes: EOM are normal. Pupils are equal, round, and reactive to light.  Left conjunctiva slightly injected. No active discharge today.   Neck: Normal range of motion. Neck supple.  Cardiovascular: Normal rate, regular rhythm and normal heart sounds.   Pulmonary/Chest: Effort normal and breath sounds normal. She has no wheezes.  Lymphadenopathy:    She has no cervical adenopathy.  Neurological: She is alert and oriented to person, place, and time.  Skin: Skin is dry.  Psychiatric: She has a normal mood and affect. Her behavior is normal.          Assessment & Plan:  Bacterial conjunctivitis of left eye- sent polytrim for 7 days. Discussed new eye make up. Cool compresses. Follow up if not improving.   Sinusitis- pt is some better but still feels like not feeling like she usually does on abx. Will switch to augmentin for 10  days.   Rectal bleeding/hemorrhoids- symptoms seem consisent with internal hemorrhoids. Will give rectal cream to use for next week. Discussed sitz baths. Encouraged probiotic. Discussed stool softener or miralax to help with good bowel movements. Discussed this was an acute visit. Please follow up if not resolving or worsening for full work up.

## 2014-06-03 ENCOUNTER — Encounter: Payer: Self-pay | Admitting: Physician Assistant

## 2014-06-03 ENCOUNTER — Ambulatory Visit (INDEPENDENT_AMBULATORY_CARE_PROVIDER_SITE_OTHER): Payer: 59 | Admitting: Physician Assistant

## 2014-06-03 VITALS — BP 118/75 | HR 97 | Wt 161.0 lb

## 2014-06-03 DIAGNOSIS — R233 Spontaneous ecchymoses: Secondary | ICD-10-CM

## 2014-06-03 DIAGNOSIS — G43811 Other migraine, intractable, with status migrainosus: Secondary | ICD-10-CM | POA: Diagnosis not present

## 2014-06-03 DIAGNOSIS — R238 Other skin changes: Secondary | ICD-10-CM

## 2014-06-03 DIAGNOSIS — Z131 Encounter for screening for diabetes mellitus: Secondary | ICD-10-CM

## 2014-06-03 DIAGNOSIS — Z13220 Encounter for screening for lipoid disorders: Secondary | ICD-10-CM

## 2014-06-03 MED ORDER — KETOROLAC TROMETHAMINE 30 MG/ML IJ SOLN
30.0000 mg | Freq: Once | INTRAMUSCULAR | Status: AC
  Administered 2014-06-03: 30 mg via INTRAMUSCULAR

## 2014-06-03 MED ORDER — CYCLOBENZAPRINE HCL 10 MG PO TABS: 10.0000 mg | ORAL_TABLET | Freq: Every day | ORAL | Status: DC

## 2014-06-03 MED ORDER — DEXAMETHASONE SODIUM PHOSPHATE 4 MG/ML IJ SOLN: 8.0000 mg | Freq: Once | INTRAMUSCULAR | Status: DC

## 2014-06-03 MED ORDER — DEXAMETHASONE SODIUM PHOSPHATE 4 MG/ML IJ SOLN
8.0000 mg | Freq: Once | INTRAMUSCULAR | Status: AC
  Administered 2014-06-03: 8 mg via INTRAMUSCULAR

## 2014-06-03 NOTE — Patient Instructions (Signed)

## 2014-06-03 NOTE — Progress Notes (Signed)
   Subjective:    Patient ID: Melissa LoronLinda Alexander, female    DOB: 1990/11/07, 24 y.o.   MRN: [id number]  HPI  Pt presents to the clinic with a persistent HA for 10days. Seems to start at base of neck and go up through the occipital region and ends behind left eye. No known trigger. She does have a lot of neck tightness. No vision changes. Ibuprofen 3 times a day dull dull the pain. At worst 8/10 but does get better then worse again. No nasal congestion, fever, chills, ST, ear pain or URI symptoms. No hx of ongoing headache. 2 grandparents had a anersym that lead to stroke and killed them. She denies any unlateral weakness, slurred speech, confusion.    Review of Systems  All other systems reviewed and are negative.      Objective:   Physical Exam  Constitutional: She is oriented to person, place, and time. She appears well-developed and well-nourished.  HENT:  Head: Normocephalic and atraumatic.  Eyes: Conjunctivae and EOM are normal. Pupils are equal, round, and reactive to light.  Neck: Normal range of motion. Neck supple.  Tightness over paraspinous muscles of neck.   Cardiovascular: Normal rate, regular rhythm and normal heart sounds.   Pulmonary/Chest: Effort normal and breath sounds normal.  Neurological: She is alert and oriented to person, place, and time. No cranial nerve deficit.  Skin: Skin is dry.  Psychiatric: She has a normal mood and affect. Her behavior is normal.          Assessment & Plan:  Migraine- ongoing for 10 days. BP good and no red flag symptoms. No signs of URI or sinusitis.  Toradol 30mg  and decadron 8mg  IM given in office today. Flexeril to take at bedtime for any muscle spasms or tightness of neck. If not resolving in 24 hours please let us know.

## 2014-06-04 DIAGNOSIS — G43811 Other migraine, intractable, with status migrainosus: Secondary | ICD-10-CM | POA: Insufficient documentation

## 2014-06-04 LAB — COMPLETE METABOLIC PANEL WITH GFR
ALBUMIN: 4.3 g/dL (ref 3.5–5.2)
ALT: 19 U/L (ref 0–35)
AST: 19 U/L (ref 0–37)
Alkaline Phosphatase: 59 U/L (ref 39–117)
BUN: 10 mg/dL (ref 6–23)
CALCIUM: 9.7 mg/dL (ref 8.4–10.5)
CHLORIDE: 102 meq/L (ref 96–112)
CO2: 26 meq/L (ref 19–32)
Creat: 0.5 mg/dL (ref 0.50–1.10)
GFR, Est Non African American: >89 mL/min
Glucose, Bld: 96 mg/dL (ref 70–99)
POTASSIUM: 4 meq/L (ref 3.5–5.3)
Sodium: 138 mEq/L (ref 135–145)
Total Bilirubin: 0.4 mg/dL (ref 0.2–1.2)
Total Protein: 6.9 g/dL (ref 6.0–8.3)

## 2014-06-04 LAB — CBC WITH DIFFERENTIAL/PLATELET
BASOS ABS: 0 10*3/uL (ref 0.0–0.1)
Basophils Relative: 0 % (ref 0–1)
EOS PCT: 0 % (ref 0–5)
Eosinophils Absolute: 0 10*3/uL (ref 0.0–0.7)
HCT: 42.1 % (ref 36.0–46.0)
Hemoglobin: 14.1 g/dL (ref 12.0–15.0)
Lymphocytes Relative: 21 % (ref 12–46)
Lymphs Abs: 1.5 10*3/uL (ref 0.7–4.0)
MCH: 27.6 pg (ref 26.0–34.0)
MCHC: 33.5 g/dL (ref 30.0–36.0)
MCV: 82.5 fL (ref 78.0–100.0)
MONO ABS: 1.2 10*3/uL — AB (ref 0.1–1.0)
MONOS PCT: 17 % — AB (ref 3–12)
MPV: 9.1 fL (ref 8.6–12.4)
Neutro Abs: 4.5 10*3/uL (ref 1.7–7.7)
Neutrophils Relative %: 62 % (ref 43–77)
PLATELETS: 301 10*3/uL (ref 150–400)
RBC: 5.1 MIL/uL (ref 3.87–5.11)
RDW: 13.6 % (ref 11.5–15.5)
WBC: 7.2 10*3/uL (ref 4.0–10.5)

## 2014-06-04 LAB — LIPID PANEL
CHOL/HDL RATIO: 3 ratio
Cholesterol: 169 mg/dL (ref 0–200)
HDL: 56 mg/dL (ref >=46)
LDL Cholesterol: 104 mg/dL — ABNORMAL HIGH (ref 0–99)
Triglycerides: 44 mg/dL (ref <150)
VLDL: 9 mg/dL (ref 0–40)

## 2014-06-07 ENCOUNTER — Encounter: Payer: Self-pay | Admitting: Physician Assistant

## 2014-06-08 ENCOUNTER — Ambulatory Visit (INDEPENDENT_AMBULATORY_CARE_PROVIDER_SITE_OTHER): Payer: 59

## 2014-06-08 ENCOUNTER — Other Ambulatory Visit: Payer: Self-pay | Admitting: Physician Assistant

## 2014-06-08 DIAGNOSIS — G43811 Other migraine, intractable, with status migrainosus: Secondary | ICD-10-CM

## 2014-06-08 DIAGNOSIS — R51 Headache: Secondary | ICD-10-CM | POA: Diagnosis not present

## 2014-06-08 MED ORDER — PREDNISONE 50 MG PO TABS: ORAL_TABLET | ORAL | Status: DC

## 2014-06-08 MED ORDER — AMOXICILLIN-POT CLAVULANATE 875-125 MG PO TABS: 1.0000 | ORAL_TABLET | Freq: Two times a day (BID) | ORAL | Status: DC

## 2014-06-08 MED ORDER — IOHEXOL 300 MG/ML  SOLN
75.0000 mL | Freq: Once | INTRAMUSCULAR | Status: AC | PRN
  Administered 2014-06-08: 75 mL via INTRAVENOUS

## 2014-06-08 NOTE — Progress Notes (Signed)
Headache for 14 days needs CT of head with contrast.

## 2014-11-16 ENCOUNTER — Ambulatory Visit (INDEPENDENT_AMBULATORY_CARE_PROVIDER_SITE_OTHER): Payer: 59 | Admitting: Physician Assistant

## 2014-11-16 ENCOUNTER — Encounter: Payer: Self-pay | Admitting: Physician Assistant

## 2014-11-16 VITALS — BP 105/66 | HR 88 | Ht 66.0 in | Wt 160.0 lb

## 2014-11-16 DIAGNOSIS — Z0000 Encounter for general adult medical examination without abnormal findings: Secondary | ICD-10-CM | POA: Diagnosis not present

## 2014-11-16 NOTE — Progress Notes (Signed)
  Subjective:     Amarie Viles is a 24 y.o. female and is here for a comprehensive physical exam. The patient reports no problems.  Social History   Social History  . Marital Status: Married    Spouse Name: N/A  . Number of Children: N/A  . Years of Education: N/A   Occupational History  . Not on file.   Social History Main Topics  . Smoking status: Never Smoker   . Smokeless tobacco: Never Used  . Alcohol Use: No  . Drug Use: No  . Sexual Activity: Not Currently   Other Topics Concern  . Not on file   Social History Narrative   Health Maintenance  Topic Date Due  . HIV Screening  12/30/2004  . INFLUENZA VACCINE  09/21/2015  . PAP SMEAR  10/11/2017  . TETANUS/TDAP  02/21/2023    The following portions of the patient's history were reviewed and updated as appropriate: allergies, current medications, past family history, past medical history, past social history, past surgical history and problem list.  Review of Systems A comprehensive review of systems was negative.   Objective:    BP 105/66 mmHg  Pulse 88  Ht  (1.676 m)  Wt 160 lb (72.576 kg)  BMI 25.84 kg/m2 General appearance: alert, cooperative and appears stated age Head: Normocephalic, without obvious abnormality, atraumatic Eyes: conjunctivae/corneas clear. PERRL, EOM's intact. Fundi benign. Ears: normal TM's and external ear canals both ears Nose: Nares normal. Septum midline. Mucosa normal. No drainage or sinus tenderness. Throat: lips, mucosa, and tongue normal; teeth and gums normal Neck: no adenopathy, no carotid bruit, no JVD, supple, symmetrical, trachea midline and thyroid not enlarged, symmetric, no tenderness/mass/nodules Back: symmetric, no curvature. ROM normal. No CVA tenderness. Lungs: clear to auscultation bilaterally Heart: regular rate and rhythm, S1, S2 normal, no murmur, click, rub or gallop Abdomen: soft, non-tender; bowel sounds normal; no masses,  no  organomegaly Extremities: extremities normal, atraumatic, no cyanosis or edema Pulses: 2+ and symmetric Skin: Skin color, texture, turgor normal. No rashes or lesions Lymph nodes: Cervical, supraclavicular, and axillary nodes normal. Neurologic: Grossly normal    Assessment:    Healthy female exam.      Plan:     CPE- pap done last month and normal at GYN. Flu shot given today. Labs reviewed and recorded. NicAlert was 0. Pt is not a smoker. HO given. Encouraged vitamin D 800 units and calcium  daily. Pt encouraged to exercise regularly.  See After Visit Summary for Counseling Recommendations

## 2015-04-27 ENCOUNTER — Encounter: Payer: Self-pay | Admitting: Osteopathic Medicine

## 2015-04-27 ENCOUNTER — Ambulatory Visit (INDEPENDENT_AMBULATORY_CARE_PROVIDER_SITE_OTHER): Payer: 59 | Admitting: Osteopathic Medicine

## 2015-04-27 VITALS — BP 129/82 | HR 97 | Temp 98.6°F | Ht 66.0 in | Wt 169.0 lb

## 2015-04-27 DIAGNOSIS — R05 Cough: Secondary | ICD-10-CM

## 2015-04-27 DIAGNOSIS — R059 Cough, unspecified: Secondary | ICD-10-CM

## 2015-04-27 MED ORDER — GUAIFENESIN-CODEINE 100-10 MG/5ML PO SOLN: 5.0000 mL | Freq: Four times a day (QID) | ORAL | Status: DC | PRN

## 2015-04-27 MED ORDER — AZITHROMYCIN 250 MG PO TABS: ORAL_TABLET | ORAL | Status: DC

## 2015-04-27 MED ORDER — METHYLPREDNISOLONE 4 MG PO TBPK: ORAL_TABLET | ORAL | Status: DC

## 2015-04-27 NOTE — Progress Notes (Signed)
HPI: Melissa LoronLinda Chalker is a 25 y.o. female who [name]  today for chief complaint of:  Chief Complaint  Patient presents with  . Cough    . Location: Chest . Quality: Off/congestion . Assoc signs/symptoms: see ROS, coughing up mucus in the mornings, at night constant coughing, denies sinus symptoms . Duration: 3 weeks . Modifying factors: has tried the following OTC/Rx medications: Allegra-D, Mucinex-D, benzonatate x 1 week.  Tessalon was prescribed by e-visit  Past medical, social and family history reviewed. Current medications and allergies reviewed.     Review of Systems: CONSTITUTIONAL: no fever/chills HEAD/EYES/EARS/NOSE/THROAT: yes headache, no vision change or hearing change, yes sore throat CARDIAC: No chest pain/pressure/palpitations RESPIRATORY: yes cough, no shortness of breath GASTROINTESTINAL: no nausea, no vomiting, no abdominal pain, no diarrhea MUSCULOSKELETAL: no myalgia/arthralgia   Exam:  BP 129/82 mmHg  Pulse 97  Temp(Src) 98.6 F (37 C) (Oral)  Ht 5\' 6"  (1.676 m)  Wt 169 lb (76.658 kg)  BMI 27.29 kg/m2 Constitutional: VSS, see above. General Appearance: alert, well-developed, well-nourished, NAD Eyes: Normal lids and conjunctive, non-icteric sclera, PERRLA Ears, Nose, Mouth, Throat: Normal external inspection ears/nares/mouth/lips/gums, normal TM, MMM;       posterior pharynx without erythema, without exudate Neck: No masses, trachea midline. normal lymph nodes Respiratory: Normal respiratory effort. No  wheeze/rhonchi/rales Cardiovascular: S1/S2 normal, no murmur/rub/gallop auscultated. RRR.    No results found for this or any previous visit (from the past 72 hour(s)). No results found.   ASSESSMENT/PLAN: Lung sounds clear, treat as postviral cough, patient advised this can linger for several weeks however if it persists or if it gets worse with certainly consider further evaluation. For now treat  symptomatically, even stronger cough syrup to take at night to help sleep, Medrol Dosepak, may consider inhaler. Printed prescription given for azithromycin, patient advised to take this medication if she is not feeling better in the next week or so. If feeling worse, would consider chest x-ray but lung exam normal now in otherwise healthy 25 year old, ER/RTC precautions reviewed with patient  Cough - Plan: methylPREDNISolone (MEDROL DOSEPAK) 4 MG TBPK tablet, azithromycin (ZITHROMAX) 250 MG tablet, guaiFENesin-codeine 100-10 MG/5ML syrup, DISCONTINUED: guaiFENesin-codeine 100-10 MG/5ML syrup    No Follow-up on file.

## 2015-04-27 NOTE — Patient Instructions (Signed)
LIKELY, THIS COUGH IS DUE TO POST-VIRAL INFECTION COUGH FROM A RESPIRATORY VIRUS, WHICH CAN LINGER FOR A FEW WEEKS. WE TYPICALLY WILL TREAT SYMPTOMS AND TRY TO CALM THINGS DOWN WITH STRONGER COUGH MEDICINE AND STEROID TAPER. USUALLY ANTIBIOTICS ARE NOT NEEDED AND THEY WILL NOT HELP. HOWEVER, DO FILL ANTIBIOTICS IF NO BETTER IN ONE WEEK OR IF YOU ARE FEELING WORSE. LET US KNOW IF YOU ARE WORSE! WE MAY NEED TO DO CHEST XRAY.   OTHER CAUSES OF PERSISTENT COUGH TO CONSIDER IF THIS ISN'T GOING AWAY ON ITS OWN CAN INCLUDE COUGH VARIANT ASTHMA, MOLD/DUST/ALLERGEN EXPOSURE, SEASONAL ALLERGIES, ACID REFLUX, AND OTHERS. IF YOUR COUGH IS LINGERING FOR MORE THAN 6 - 8 WEEKS, WE WILL DEFINITELY NEED TO DO A CHEST XRAY AND EVALUATE FOR THESE OTHER PROBLEMS.   PLEASE CALL THE OFFICE IF ANY QUESTIONS OR CONCERNS! TAKE CARE! -DR. A.     Cough, Adult Coughing is a reflex that clears your throat and your airways. Coughing helps to heal and protect your lungs. It is normal to cough occasionally, but a cough that happens with other symptoms or lasts a long time may be a sign of a condition that needs treatment. A cough may last only 2-3 weeks (acute), or it may last longer than 8 weeks (chronic). CAUSES Coughing is commonly caused by:  Breathing in substances that irritate your lungs.  A viral or bacterial respiratory infection.  Allergies.  Asthma.  Postnasal drip.  Smoking.  Acid backing up from the stomach into the esophagus (gastroesophageal reflux).  Certain medicines.  Chronic lung problems, including COPD (or rarely, lung cancer).  Other medical conditions such as heart failure. HOME CARE INSTRUCTIONS  Pay attention to any changes in your symptoms. Take these actions to help with your discomfort:  Take medicines only as told by your health care provider.  If you were prescribed an antibiotic medicine, take it as told by your health care provider. Do not stop taking the antibiotic even if you  start to feel better.  Talk with your health care provider before you take a cough suppressant medicine.  Drink enough fluid to keep your urine clear or pale yellow.  If the air is dry, use a cold steam vaporizer or humidifier in your bedroom or your home to help loosen secretions.  Avoid anything that causes you to cough at work or at home.  If your cough is worse at night, try sleeping in a semi-upright position.  Avoid cigarette smoke. If you smoke, quit smoking. If you need help quitting, ask your health care provider.  Avoid caffeine.  Avoid alcohol.  Rest as needed. SEEK MEDICAL CARE IF:   You have new symptoms.  You cough up pus.  Your cough does not get better after 2-3 weeks, or your cough gets worse.  You cannot control your cough with suppressant medicines and you are losing sleep.  You develop pain that is getting worse or pain that is not controlled with pain medicines.  You have a fever.  You have unexplained weight loss.  You have night sweats. SEEK IMMEDIATE MEDICAL CARE IF:  You cough up blood.  You have difficulty breathing.  Your heartbeat is very fast.   This information is not intended to replace advice given to you by your health care provider. Make sure you discuss any questions you have with your health care provider.   Document Released: 08/05/2010 Document Revised: 10/28/2014 Document Reviewed: 04/15/2014 Elsevier Interactive Patient Education [employer]2016 [employer]

## 2015-06-07 ENCOUNTER — Encounter: Payer: Self-pay | Admitting: Osteopathic Medicine

## 2015-06-07 ENCOUNTER — Ambulatory Visit (INDEPENDENT_AMBULATORY_CARE_PROVIDER_SITE_OTHER): Payer: 59 | Admitting: Osteopathic Medicine

## 2015-06-07 VITALS — BP 119/79 | HR 91 | Temp 98.3°F | Wt 171.0 lb

## 2015-06-07 DIAGNOSIS — B9789 Other viral agents as the cause of diseases classified elsewhere: Secondary | ICD-10-CM

## 2015-06-07 DIAGNOSIS — J069 Acute upper respiratory infection, unspecified: Secondary | ICD-10-CM

## 2015-06-07 DIAGNOSIS — J029 Acute pharyngitis, unspecified: Secondary | ICD-10-CM | POA: Diagnosis not present

## 2015-06-07 DIAGNOSIS — J028 Acute pharyngitis due to other specified organisms: Secondary | ICD-10-CM

## 2015-06-07 LAB — POCT RAPID STREP A (OFFICE): Rapid Strep A Screen: NEGATIVE

## 2015-06-07 MED ORDER — LIDOCAINE VISCOUS 2 % MT SOLN: 15.0000 mL | OROMUCOSAL | Status: DC | PRN

## 2015-06-07 MED ORDER — IPRATROPIUM BROMIDE 0.03 % NA SOLN: 2.0000 | Freq: Three times a day (TID) | NASAL | Status: DC | PRN

## 2015-06-07 MED ORDER — BENZONATATE 200 MG PO CAPS: 200.0000 mg | ORAL_CAPSULE | Freq: Three times a day (TID) | ORAL | Status: DC | PRN

## 2015-06-07 MED ORDER — METHYLPREDNISOLONE 4 MG PO TBPK: ORAL_TABLET | ORAL | Status: DC

## 2015-06-07 NOTE — Patient Instructions (Signed)
Continue ibuprofen or Tylenol as needed for fever. If nighttime fever persists, particularly if all your other symptoms O away, please let us know as this may require further evaluation for more unusual causes such as mono. If your symptoms worsen or fail to improve by this Wednesday or Thursday, please call the office. See below for prescription education use. Please let us know if you have any other questions or concerns.

## 2015-06-07 NOTE — Progress Notes (Signed)
HPI: Melissa Alexander is a 25 y.o. female who [name]  today for chief complaint of:  Chief Complaint  Patient presents with  . Sore Throat   ACUTE ILLNESS . Location: throat . Quality: sore  . Assoc signs/symptoms: see ROS . Duration: 5-6 days . Modifying factors: has tried the following OTC/Rx medications: ibuprofen last night when had fever     Past medical, social and family history reviewed. Current medications and allergies reviewed.     Review of Systems: CONSTITUTIONAL: yes fever/chills HEAD/EYES/EARS/NOSE/THROAT: yes headache, no vision change or hearing change, yes sore throat CARDIAC: No chest pain/pressure/palpitations RESPIRATORY: yes cough, no shortness of breath GASTROINTESTINAL: no nausea, no vomiting, no abdominal pain, no diarrhea MUSCULOSKELETAL: yes myalgia/arthralgia - generalized     Exam:  BP 119/79 mmHg  Pulse 91  Temp(Src) 98.3 F (36.8 C)  Wt 171 lb (77.565 kg) Constitutional: VSS, see above. General Appearance: alert, well-developed, well-nourished, NAD Eyes: Normal lids and conjunctive, non-icteric sclera,  Ears, Nose, Mouth, Throat: Normal external inspection ears/nares/mouth/lips/gums, normal TM, MMM;       posterior pharynx without erythema, without exudate, (+) maxillary sinus tenderness bilaterally to percussion Neck: No masses, trachea midline. normal lymph nodes Respiratory: Normal respiratory effort. No  wheeze/rhonchi/rales Cardiovascular: S1/S2 normal, no murmur/rub/gallop auscultated. RRR.  STREP NEGATIVE  MODIFIED CENTOR CRITERIA (ponts if "yes"): Tonsillar exudate (1): no Tender Ant Cervical LN (1): no Absence of cough (1): no Fever (1): yes Age  91-14 (1): no 15-45 (0): yes >/= 45 (-1): no SCORE: 1 TREATMENT: -1,0,1 = SUPPORTIVE CARE 2 - 3 = TEST, TX (+)SWAB, CX (-)SWAB 4 - 5 = TX    ASSESSMENT/PLAN: See pt instructions    URI (upper respiratory infection) - Plan:  methylPREDNISolone (MEDROL DOSEPAK) 4 MG TBPK tablet, ipratropium (ATROVENT) 0.03 % nasal spray, benzonatate (TESSALON) 200 MG capsule  Acute viral pharyngitis - Plan: lidocaine (XYLOCAINE) 2 % solution    Return if symptoms worsen or fail to improve.

## 2015-06-07 NOTE — Addendum Note (Signed)
Addended by: Avon GullyMCCRIMMON, ANDREA C on: 06/07/2015 11:29 AM   Modules accepted: Orders

## 2015-06-09 ENCOUNTER — Telehealth: Payer: Self-pay

## 2015-06-09 MED ORDER — AZITHROMYCIN 250 MG PO TABS: ORAL_TABLET | ORAL | Status: DC

## 2015-06-09 NOTE — Telephone Encounter (Signed)
Patient has been informed. Kaelee Pfeffer,CMA  

## 2015-06-09 NOTE — Telephone Encounter (Signed)
Melissa QuinLinda was seen Monday. She is not feeling any better. She now has a cough with green sputum. She complains of fever at night. Please advise.

## 2015-06-09 NOTE — Telephone Encounter (Signed)
I wouldn't worry about color of sputum, this does not necessarily indicate a severe infection. If she is concerned, she should come in for a chest xray and repeat exam. I sent in some antibiotics for her but she will need to be seen in the office if not better.

## 2015-06-29 ENCOUNTER — Encounter: Payer: Self-pay | Admitting: Physician Assistant

## 2015-06-29 ENCOUNTER — Ambulatory Visit (INDEPENDENT_AMBULATORY_CARE_PROVIDER_SITE_OTHER): Payer: 59 | Admitting: Physician Assistant

## 2015-06-29 VITALS — BP 111/58 | HR 92 | Temp 98.7°F | Ht 66.0 in | Wt 172.0 lb

## 2015-06-29 DIAGNOSIS — J302 Other seasonal allergic rhinitis: Secondary | ICD-10-CM | POA: Diagnosis not present

## 2015-06-29 DIAGNOSIS — A499 Bacterial infection, unspecified: Secondary | ICD-10-CM | POA: Diagnosis not present

## 2015-06-29 DIAGNOSIS — J309 Allergic rhinitis, unspecified: Secondary | ICD-10-CM | POA: Insufficient documentation

## 2015-06-29 DIAGNOSIS — H109 Unspecified conjunctivitis: Secondary | ICD-10-CM | POA: Insufficient documentation

## 2015-06-29 DIAGNOSIS — H1089 Other conjunctivitis: Secondary | ICD-10-CM | POA: Diagnosis not present

## 2015-06-29 MED ORDER — POLYMYXIN B-TRIMETHOPRIM 10000-0.1 UNIT/ML-% OP SOLN: 1.0000 [drp] | OPHTHALMIC | Status: DC

## 2015-06-29 NOTE — Progress Notes (Addendum)
   Subjective:    Patient ID: Melissa Alexander, female    DOB: [birth year], 25 y.o.   MRN: [id number]  HPI Patient is here today complaining of draining, redness, and crusting in the left eye x1day. Denies itching, does report mild pain. Patient states many of her coworkers have had pink eye. Denies fever, congestion, runny nose, ear pain/pressure.   Patient also has a history of allergies. She has been to an allergist before and went through testing. She was not a candidate for injections. She is having increased congestion, sinus pressure and sneezing. She is wondering if there is anything she can do to make her better. She takes Zyrtec daily.    Review of Systems  Eyes: Positive for pain (Left), discharge (Left) and redness (Left). Negative for photophobia, itching and visual disturbance.  All other systems reviewed and are negative.      Objective:   Physical Exam  Constitutional: She appears well-developed and well-nourished.  HENT:  Head: Normocephalic and atraumatic.  Right Ear: External ear normal.  Left Ear: External ear normal.  Nose: Nose normal.  Mouth/Throat: Oropharynx is clear and moist. No oropharyngeal exudate.  Eyes: Right eye exhibits no chemosis, no discharge and no exudate. Left eye exhibits chemosis and discharge. Left eye exhibits no exudate and no hordeolum. No foreign body present in the left eye. Right conjunctiva is not injected. Left conjunctiva is injected. No scleral icterus.  Left conjunctiva injected. Crusting visualized along the lateral canthus .    Neck: Normal range of motion. Neck supple.  Cardiovascular: Normal rate, regular rhythm and normal heart sounds.   Pulmonary/Chest: Effort normal and breath sounds normal.  Lymphadenopathy:    She has no cervical adenopathy.  Psychiatric: She has a normal mood and affect. Her behavior is normal.          Assessment & Plan:  1. Bacterial conjuncitivitis of left eye- Patient states she woke up this  morning with drainage, crusting, and redness in the right eye. She has been exposed to pink eye at work. On PE, left conjunctiva is injected with mild chemosis. I am going to treat with Polytrim for 1 week.   2. Seasonal allergies/allergic rhinitis- I recommend that patient switch antihistamines weekly for the next month or so and add Flonase to regimen. If not improving, we can then make referral to allergist.

## 2015-06-29 NOTE — Addendum Note (Signed)
Addended byJomarie Longs: BREEBACK, JADE L on: 06/29/2015 12:26 PM   Modules accepted: Medications

## 2015-06-29 NOTE — Patient Instructions (Addendum)

## 2015-09-28 ENCOUNTER — Encounter: Payer: Self-pay | Admitting: Physician Assistant

## 2015-09-28 ENCOUNTER — Ambulatory Visit (INDEPENDENT_AMBULATORY_CARE_PROVIDER_SITE_OTHER): Payer: 59 | Admitting: Physician Assistant

## 2015-09-28 VITALS — BP 120/65 | HR 94 | Temp 98.5°F | Ht 66.0 in | Wt 171.0 lb

## 2015-09-28 DIAGNOSIS — A499 Bacterial infection, unspecified: Secondary | ICD-10-CM

## 2015-09-28 DIAGNOSIS — J329 Chronic sinusitis, unspecified: Secondary | ICD-10-CM

## 2015-09-28 DIAGNOSIS — B9689 Other specified bacterial agents as the cause of diseases classified elsewhere: Secondary | ICD-10-CM

## 2015-09-28 MED ORDER — AZITHROMYCIN 250 MG PO TABS: ORAL_TABLET | ORAL | 0 refills | Status: DC

## 2015-09-28 MED ORDER — MONTELUKAST SODIUM 10 MG PO TABS: 10.0000 mg | ORAL_TABLET | Freq: Every day | ORAL | 4 refills | Status: DC

## 2015-09-28 NOTE — Patient Instructions (Signed)

## 2015-09-28 NOTE — Progress Notes (Addendum)
   Subjective:    Patient ID: Melissa Alexander, female    DOB: 1991/01/30, 25 y.o.   MRN: [id number]  HPI Patient is a [age] year old female who 2 weeks of sinus pressure, sore throat, ear pain. She has tried over-the-counter Sudafed and Mucinex. She is having little to no relief. She has had a minor dry cough. She denies any fever. She has been working long hours at work.    Review of Systems  All other systems reviewed and are negative.      Objective:   Physical Exam  Constitutional: She is oriented to person, place, and time. She appears well-developed and well-nourished.  HENT:  Head: Normocephalic and atraumatic.  TM's dull without erythema.  Tenderness over bilateral maxillary sinuses.  Nasal turbinates red and swollen.  Oropharynx erythematous. No tonsilar swelling or exudate.    Eyes: Conjunctivae are normal. Pupils are equal, round, and reactive to light. Right eye exhibits no discharge. Left eye exhibits no discharge.  Neck: Normal range of motion. Neck supple. No thyromegaly present.  Cardiovascular: Normal rate, regular rhythm and normal heart sounds.   Pulmonary/Chest: Effort normal and breath sounds normal.  Lymphadenopathy:    She has no cervical adenopathy.  Neurological: She is alert and oriented to person, place, and time.  Psychiatric: She has a normal mood and affect. Her behavior is normal.          Assessment & Plan:  Bacterial sinusitis- treated with zpak. Discussed symptomatic care. Follow up if not improving.   Refilled singulair for 1 year.

## 2015-11-21 ENCOUNTER — Encounter: Payer: Self-pay | Admitting: Emergency Medicine

## 2015-11-21 ENCOUNTER — Emergency Department
Admission: EM | Admit: 2015-11-21 | Discharge: 2015-11-21 | Disposition: A | Discharge status: Home / Self Care | Payer: 59 | Source: Home / Self Care | Attending: Family Medicine | Admitting: Family Medicine

## 2015-11-21 ENCOUNTER — Emergency Department (INDEPENDENT_AMBULATORY_CARE_PROVIDER_SITE_OTHER): Payer: 59

## 2015-11-21 DIAGNOSIS — J3489 Other specified disorders of nose and nasal sinuses: Secondary | ICD-10-CM

## 2015-11-21 DIAGNOSIS — B9789 Other viral agents as the cause of diseases classified elsewhere: Secondary | ICD-10-CM | POA: Diagnosis not present

## 2015-11-21 DIAGNOSIS — J069 Acute upper respiratory infection, unspecified: Secondary | ICD-10-CM

## 2015-11-21 MED ORDER — METHYLPREDNISOLONE SODIUM SUCC 125 MG IJ SOLR
125.0000 mg | Freq: Once | INTRAMUSCULAR | Status: AC
  Administered 2015-11-21: 125 mg via INTRAMUSCULAR

## 2015-11-21 MED ORDER — PREDNISONE 20 MG PO TABS: ORAL_TABLET | ORAL | 0 refills | Status: DC

## 2015-11-21 MED ORDER — AZITHROMYCIN 250 MG PO TABS: ORAL_TABLET | ORAL | 0 refills | Status: DC

## 2015-11-21 NOTE — Discharge Instructions (Signed)
Start prednisone Monday 11/22/15. Take plain guaifenesin (1200mg  extended release tabs such as Mucinex) twice daily, with plenty of water, for cough and congestion.  May add Pseudoephedrine (30mg , one or two every 4 to 6 hours) for sinus congestion.  Get adequate rest.   May use Afrin nasal spray (or generic oxymetazoline) twice daily for about 5 days and then discontinue.  Also recommend using saline nasal spray several times daily and saline nasal irrigation (AYR is a common brand).  Use Flonase nasal spray each morning after using Afrin nasal spray and saline nasal irrigation. Try warm salt water gargles for sore throat.  Continue Singulair. Stop all antihistamines for now, and other non-prescription cough/cold preparations. May take Delsym Cough Suppressant at bedtime for nighttime cough.  Begin Azithromycin if not improving about one week or if persistent fever develops  Follow-up with family doctor if not improving about10 days.

## 2015-11-21 NOTE — ED Triage Notes (Signed)
Patient reports 5 day history of aches, cough and congestion with fever. No OTC today.

## 2015-11-21 NOTE — ED Provider Notes (Signed)
[facility]    CSN: [id number] Arrival date & time: 11/21/15  1328     History   Chief Complaint Chief Complaint  Patient presents with  . Cough  . Nasal Congestion  . Generalized Body Aches    HPI Melissa Alexander is a 25 y.o. female.   About 9 days ago patient developed typical cold-like symptoms developing over several days, including mild sore throat, sinus congestion, headache, fatigue, myalgias, and cough.  She states that she gets frequent sinus infections, and now complains of bilateral facial soreness.  She states that she had a fever to 101 this morning. She had a URI just last month, and she is concerned that she may have recurrent sinusitis.      Past Medical History:  Diagnosis Date  . Acute sinus infection     Patient Active Problem List   Diagnosis Date Noted  . Bacterial conjunctivitis of left eye 06/29/2015  . Rhinitis, allergic 06/29/2015  . Seasonal allergies 06/29/2015  . Other migraine with status migrainosus, intractable 06/04/2014  . Hemorrhoid 04/08/2014  . Rectal bleeding 04/08/2014  . Cough 01/02/2014    History reviewed. No pertinent surgical history.  OB History    Gravida Para Term Preterm AB Living   1             SAB TAB Ectopic Multiple Live Births                   Home Medications    Prior to Admission medications   Medication Sig Start Date End Date Taking? Authorizing Provider  azithromycin (ZITHROMAX) 250 MG tablet Take 2 tablets now and then one tablet for 4 days (Rx void after 11/29/15) 11/21/15   Lattie Haw, MD  cetirizine (ZYRTEC) 10 MG tablet Take 10 mg by mouth daily.    Historical Provider, MD  levonorgestrel-ethinyl estradiol (AVIANE,ALESSE,LESSINA) 0.1-20 MG-MCG tablet Take 1 tablet by mouth daily.    Historical Provider, MD  montelukast (SINGULAIR) 10 MG tablet Take 1 tablet (10 mg total) by mouth at bedtime. 09/28/15   Jade L Breeback, PA-C  predniSONE (DELTASONE) 20 MG tablet Take one tab by  mouth twice daily for 5 days, then one daily for 3 days. Take with food. 11/21/15   Lattie Haw, MD  trimethoprim-polymyxin b (POLYTRIM) ophthalmic solution Place 1 drop into the left eye every 4 (four) hours. For 7-10 days. 06/29/15   Jomarie Longs, PA-C    Family History Family History  Problem Relation Age of Onset  . Diabetes Mother   . Hypertension Mother   . Aneurysm Mother   . Hyperlipidemia Mother   . Cancer Father     skin  . Heart failure Father   . Hyperlipidemia Father   . Hypertension Father   . Cancer Paternal Aunt   . Stroke Maternal Grandmother   . Stroke Paternal Grandfather     Social History Social History  Substance Use Topics  . Smoking status: Never Smoker  . Smokeless tobacco: Never Used  . Alcohol use No     Allergies   Review of patient's allergies indicates no known allergies.   Review of Systems Review of Systems     Physical Exam Triage Vital Signs ED Triage Vitals  Enc Vitals Group     BP 11/21/15 1406 111/74     Pulse Rate 11/21/15 1406 95     Resp 11/21/15 1406 16     Temp 11/21/15 1406 98.1 F (36.7 C)  Temp Source 11/21/15 1406 Oral     SpO2 11/21/15 1406 95 %     Weight 11/21/15 1407 170 lb (77.1 kg)     Height 11/21/15 1407 5\' 4"  (1.626 m)     Head Circumference --      Peak Flow --      Pain Score 11/21/15 1408 2     Pain Loc --      Pain Edu? --      Excl. in GC? --    No data found.   Updated Vital Signs BP 111/74 (BP Location: Left Arm)   Pulse 95   Temp 98.1 F (36.7 C) (Oral)   Resp 16   Ht 5\' 4"  (1.626 m)   Wt 170 lb (77.1 kg)   LMP 11/17/2015 (Exact Date)   SpO2 95%   BMI 29.18 kg/m   Visual Acuity Right Eye Distance:   Left Eye Distance:   Bilateral Distance:    Right Eye Near:   Left Eye Near:    Bilateral Near:     Physical Exam Nursing notes and Vital Signs reviewed. Appearance:  Patient appears stated age, and in no acute distress Eyes:  Pupils are equal, round, and reactive to  light and accomodation.  Extraocular movement is intact.  Conjunctivae are not inflamed  Ears:  Canals normal.  Tympanic membranes normal.  Nose:  Mildly congested turbinates.  Maxillary sinus tenderness is present.  Pharynx:  Normal Neck:  Supple.  Tender enlarged posterior/lateral nodes are palpated bilaterally  Lungs:  Clear to auscultation.  Breath sounds are equal.  Moving air well. Chest:  Distinct tenderness to palpation over the mid-sternum.  Heart:  Regular rate and rhythm without murmurs, rubs, or gallops.  Abdomen:  Nontender without masses or hepatosplenomegaly.  Bowel sounds are present.  No CVA or flank tenderness.  Extremities:  No edema.  Skin:  No rash present.    UC Treatments / Results  Labs (all labs ordered are listed, but only abnormal results are displayed) Labs Reviewed - No data to display  EKG  EKG Interpretation None       Radiology Dg Sinuses Complete  Result Date: 11/21/2015 CLINICAL DATA:  Pt states she has had left sided sinus pressure x 1 week. EXAM: PARANASAL SINUSES - COMPLETE 3 + VIEW COMPARISON:  None. FINDINGS: The paranasal sinus are aerated. There is no evidence of sinus opacification air-fluid levels or mucosal thickening. No significant bone abnormalities are seen. IMPRESSION: Negative. Electronically Signed   By: Amie Portland M.D.   On: 11/21/2015 15:50    Procedures Procedures (including critical care time)  Medications Ordered in UC Medications  methylPREDNISolone sodium succinate (SOLU-MEDROL) 125 mg/2 mL injection 125 mg (not administered)     Initial Impression / Assessment and Plan / UC Course  I have reviewed the triage vital signs and the nursing notes.  Pertinent labs & imaging results that were available during my care of the patient were reviewed by me and considered in my medical decision making (see chart for details).  Clinical Course  There is no evidence of bacterial infection today.   Administered Solumedrol  125mg  IM  Start prednisone burst/taper Monday 11/22/15. Take plain guaifenesin (1200mg  extended release tabs such as Mucinex) twice daily, with plenty of water, for cough and congestion.  May add Pseudoephedrine (30mg , one or two every 4 to 6 hours) for sinus congestion.  Get adequate rest.   May use Afrin nasal spray (or generic oxymetazoline) twice daily  for about 5 days and then discontinue.  Also recommend using saline nasal spray several times daily and saline nasal irrigation (AYR is a common brand).  Use Flonase nasal spray each morning after using Afrin nasal spray and saline nasal irrigation. Try warm salt water gargles for sore throat.  Continue Singulair. Stop all antihistamines for now, and other non-prescription cough/cold preparations. May take Delsym Cough Suppressant at bedtime for nighttime cough.  Begin Azithromycin if not improving about one week or if persistent fever develops (Given a prescription to hold, with an expiration date)  Follow-up with family doctor if not improving about 10 days.      Final Clinical Impressions(s) / UC Diagnoses   Final diagnoses:  Viral URI with cough    New Prescriptions New Prescriptions   PREDNISONE (DELTASONE) 20 MG TABLET    Take one tab by mouth twice daily for 5 days, then one daily for 3 days. Take with food.     Lattie HawStephen A Rayne Loiseau, MD 11/22/15 2006

## 2016-02-23 ENCOUNTER — Encounter: Payer: Self-pay | Admitting: Physician Assistant

## 2016-02-24 ENCOUNTER — Ambulatory Visit (INDEPENDENT_AMBULATORY_CARE_PROVIDER_SITE_OTHER): Payer: 59 | Admitting: Physician Assistant

## 2016-02-24 VITALS — BP 112/74 | HR 98 | Wt 182.0 lb

## 2016-02-24 DIAGNOSIS — R632 Polyphagia: Secondary | ICD-10-CM

## 2016-02-24 DIAGNOSIS — M545 Low back pain, unspecified: Secondary | ICD-10-CM

## 2016-02-24 DIAGNOSIS — R1032 Left lower quadrant pain: Secondary | ICD-10-CM | POA: Diagnosis not present

## 2016-02-24 DIAGNOSIS — R350 Frequency of micturition: Secondary | ICD-10-CM

## 2016-02-24 DIAGNOSIS — D72829 Elevated white blood cell count, unspecified: Secondary | ICD-10-CM

## 2016-02-24 LAB — HEPATIC FUNCTION PANEL
ALT: 13 U/L (ref 7–35)
AST: 9 U/L — AB (ref 13–35)
Alkaline Phosphatase: 58 U/L (ref 25–125)
BILIRUBIN, TOTAL: 0.3 mg/dL

## 2016-02-24 LAB — CBC AND DIFFERENTIAL
HEMATOCRIT: 39 % (ref 36–46)
Hemoglobin: 12.7 g/dL (ref 12.0–16.0)
Neutrophils Absolute: 8 /uL
PLATELETS: 360 10*3/uL (ref 150–399)
WBC: 13.5 10*3/mL

## 2016-02-24 LAB — POCT URINALYSIS DIPSTICK
Bilirubin, UA: NEGATIVE
Blood, UA: NEGATIVE
GLUCOSE UA: NEGATIVE
KETONES UA: NEGATIVE
Leukocytes, UA: NEGATIVE
Protein, UA: NEGATIVE
SPEC GRAV UA: 1.015
Urobilinogen, UA: 0.2
pH, UA: 5.5

## 2016-02-24 LAB — BASIC METABOLIC PANEL
BUN: 18 mg/dL (ref 4–21)
Creatinine: 0.6 mg/dL (ref 0.5–1.1)
GLUCOSE: 108 mg/dL
Potassium: 4 mmol/L (ref 3.4–5.3)
Sodium: 139 mmol/L (ref 137–147)

## 2016-02-24 LAB — POCT GLYCOSYLATED HEMOGLOBIN (HGB A1C): Hemoglobin A1C: 5.4

## 2016-02-24 NOTE — Patient Instructions (Signed)
Please go to [facility] [address], [place], [place] [id number] for blood work They are open 8a-5p (closed 12-1p for lunch) [phone]  You will receive a call to schedule your pelvic ultrasound  We will contact you with the results of your urine culture  Please call or return for new or worsening symptoms

## 2016-02-24 NOTE — Progress Notes (Signed)
HPI:                                                                Melissa LoronLinda Alexander is a 26 y.o. female who [facility] [place] [facility] today for abdominal pain  Patient with history of ovarian cyst complains of constant left lower abdominal pain x 3 weeks. She was seen by her GYN and had a negative pregnancy test on 02/17/16. Serum BHCQ Quant was also drawn and 1. She denies abnormal vaginal discharge, bleeding, or dyspareunia. Patient also endorses increased appetite that wakes her up at night, increased thirst, and increased urinary frequency.  Family history of DM II in her mother.   Patient is currently taking Doxycycline and Prednisone for an acute sinusitis.    Review of Systems  Constitutional: Negative for chills, fever, malaise/fatigue and weight loss.  HENT: Positive for congestion and sinus pain.   Respiratory: Positive for cough.   Gastrointestinal: Positive for abdominal pain (left lower quadrant). Negative for constipation, diarrhea, nausea and vomiting.       Increased appetitie  Genitourinary: Positive for frequency. Negative for dysuria, hematuria and urgency.       Polyuria  Musculoskeletal: Positive for back pain (midline).  Skin: Negative for rash.  Neurological: Negative.   Endo/Heme/Allergies: Positive for polydipsia (polyphagia).    Health Maintenance Health Maintenance  Topic Date Due  . HIV Screening  12/30/2004  . INFLUENZA VACCINE  09/21/2015  . PAP SMEAR  10/11/2017  . TETANUS/TDAP  02/21/2023    GYN/Sexual Health  Menstrual status: having periods  LMP: 01/11/16, neg preg last week  Current contraception: OCP    Past Medical History:  Diagnosis Date  . Acute sinus infection    No past surgical history on file. Social History  Substance Use Topics  . Smoking status: Never Smoker  . Smokeless tobacco: Never Used  . Alcohol use No   family history includes Aneurysm in her mother; Cancer in her  father and paternal aunt; Diabetes in her mother; Heart failure in her father; Hyperlipidemia in her father and mother; Hypertension in her father and mother; Stroke in her maternal grandmother and paternal grandfather.  ROS: negative except as noted in the HPI  Medications: Current Outpatient Prescriptions  Medication Sig Dispense Refill  . cetirizine (ZYRTEC) 10 MG tablet Take 10 mg by mouth daily.    Marland Kitchen. doxycycline (VIBRA-TABS) 100 MG tablet Take 100 mg by mouth.    . levonorgestrel-ethinyl estradiol (AVIANE,ALESSE,LESSINA) 0.1-20 MG-MCG tablet Take 1 tablet by mouth daily.    . montelukast (SINGULAIR) 10 MG tablet Take 1 tablet (10 mg total) by mouth at bedtime. 90 tablet 4  . trimethoprim-polymyxin b (POLYTRIM) ophthalmic solution Place 1 drop into the left eye every 4 (four) hours. For 7-10 days. 10 mL 0   No current facility-administered medications for this visit.    No Known Allergies     Objective:  BP 112/74   Pulse 98   Wt 182 lb (82.6 kg)   BMI 31.24 kg/m  Gen: well-groomed, cooperative, not ill-appearing, no distress Lungs: Normal work of breathing, clear to auscultation bilaterally Heart: Normal rate, regular rhythm, s1 and s2 distinct, no murmurs, clicks or rubs appreciated on this exam, no carotid bruit Abd:  Soft. Obese. Nondistended, Tender in LLQ, no rebound, no guarding, no CVA tenderness Skin: warm and dry, no rashes or lesions on exposed skin  Results for orders placed or performed in visit on 02/24/16 (from the past 72 hour(s))  POCT HgB A1C     Status: None   Collection Time: 02/24/16  3:20 PM  Result Value Ref Range   Hemoglobin A1C 5.4   POCT Urinalysis Dipstick     Status: None   Collection Time: 02/24/16  3:27 PM  Result Value Ref Range   Color, UA yellow    Clarity, UA clear    Glucose, UA negative    Bilirubin, UA negative    Ketones, UA negative    Spec Grav, UA 1.015    Blood, UA negative    pH, UA 5.5    Protein, UA negative     Urobilinogen, UA 0.2    Nitrite, UA neagative    Leukocytes, UA Negative Negative   No results found.  I personally reviewed the visit note and urinalysis from 02/17/16 which showed rare RBC's, occasional bacteria, 6-8 WBC's.  Assessment and Plan: 26 y.o. female with   Abdominal pain, left lower quadrant - I suspect this is a recurrence of an ovarian cyst. Transvaginal US pending - Repeat UA today was negative. Sent for culture. Patient is currently on Doxy for sinusitis - Comprehensive metabolic panel - CBC with Differential/Platelet   Orders Placed This Encounter  Procedures  . Urine culture    Standing Status:   Future    Number of Occurrences:   1    Standing Expiration Date:   02/23/2017  . US Pelvis Complete    Order Specific Question:   Reason for Exam (SYMPTOM  OR DIAGNOSIS REQUIRED)    Answer:   Pelvic and transvaginal ultrasound    Order Specific Question:   Preferred imaging location?    Answer:   [facility]  . Comprehensive metabolic panel    Order Specific Question:   Has the patient fasted?    Answer:   No  . CBC with Differential/Platelet  . POCT Urinalysis Dipstick  . POCT HgB A1C     Patient education and anticipatory guidance given Patient agrees with treatment plan Follow-up as needed if symptoms worsen or fail to improve  Levonne Hubert PA-C

## 2016-02-25 ENCOUNTER — Other Ambulatory Visit: Payer: Self-pay | Admitting: Physician Assistant

## 2016-02-25 DIAGNOSIS — R632 Polyphagia: Secondary | ICD-10-CM | POA: Insufficient documentation

## 2016-02-25 DIAGNOSIS — D72829 Elevated white blood cell count, unspecified: Secondary | ICD-10-CM | POA: Insufficient documentation

## 2016-02-25 DIAGNOSIS — R1032 Left lower quadrant pain: Secondary | ICD-10-CM

## 2016-02-25 NOTE — Progress Notes (Signed)
Patient's labs show a leukocytosis WBC 13.5 Neutrophils 8.3 Lymphocytes 3.6 Monocytes 1.4  Patient is being treated with antibiotics for an acute sinusitis.   Recommend close follow-up with PCP  Levonne Hubertharley E. Cummings PA-C

## 2016-02-26 LAB — URINE CULTURE: Organism ID, Bacteria: NO GROWTH

## 2016-02-28 ENCOUNTER — Ambulatory Visit (INDEPENDENT_AMBULATORY_CARE_PROVIDER_SITE_OTHER): Payer: 59

## 2016-02-28 DIAGNOSIS — R1032 Left lower quadrant pain: Secondary | ICD-10-CM

## 2016-03-21 ENCOUNTER — Emergency Department
Admission: EM | Admit: 2016-03-21 | Discharge: 2016-03-21 | Disposition: A | Discharge status: Home / Self Care | Payer: 59 | Source: Home / Self Care | Attending: Emergency Medicine | Admitting: Emergency Medicine

## 2016-03-21 DIAGNOSIS — R109 Unspecified abdominal pain: Secondary | ICD-10-CM

## 2016-03-21 LAB — POCT URINALYSIS DIP (MANUAL ENTRY)
Bilirubin, UA: NEGATIVE
Glucose, UA: NEGATIVE
Leukocytes, UA: NEGATIVE
Nitrite, UA: NEGATIVE
Protein Ur, POC: NEGATIVE
Spec Grav, UA: 1.015 (ref 1.005–1.03)
Urobilinogen, UA: 0.2 (ref 0–1)
pH, UA: 6 (ref 5–8)

## 2016-03-21 LAB — POCT URINE PREGNANCY: PREG TEST UR: NEGATIVE

## 2016-03-21 NOTE — Discharge Instructions (Signed)
Try pepcid.   See Lesly RubensteinJade for recheck in 2-3 days.   Go to the Emergency department if symptoms worsen or change

## 2016-03-21 NOTE — ED Triage Notes (Signed)
Saturday started with pain across upper abdomen.  Denies constipation

## 2016-03-22 NOTE — ED Provider Notes (Signed)
CSN: [id number]     Arrival date & time 03/21/16  1903 History   None    Chief Complaint  Patient presents with  . Abdominal Pain   (Consider location/radiation/quality/duration/timing/severity/associated sxs/prior Treatment) The history is provided by the patient. No language interpreter was used.  Abdominal Pain  Pain location:  LUQ and RUQ Pain quality: aching   Pain radiates to:  Does not radiate Pain severity:  Moderate Onset quality:  Gradual Duration:  3 days Timing:  Constant Progression:  Worsening Chronicity:  New Relieved by:  Nothing Worsened by:  Nothing Ineffective treatments:  None tried Associated symptoms: no diarrhea, no dysuria, no fever, no nausea and no vomiting   Risk factors: not pregnant   Pt complains of soreness in upper abdomen for 3 days.  Pt here with husband who has influenza.  Past Medical History:  Diagnosis Date  . Acute sinus infection    History reviewed. No pertinent surgical history. Family History  Problem Relation Age of Onset  . Diabetes Mother   . Hypertension Mother   . Aneurysm Mother   . Hyperlipidemia Mother   . Cancer Father     skin  . Heart failure Father   . Hyperlipidemia Father   . Hypertension Father   . Cancer Paternal Aunt   . Stroke Maternal Grandmother   . Stroke Paternal Grandfather    Social History  Substance Use Topics  . Smoking status: Never Smoker  . Smokeless tobacco: Never Used  . Alcohol use No   OB History    Gravida Para Term Preterm AB Living   1             SAB TAB Ectopic Multiple Live Births                 Review of Systems  Constitutional: Negative for fever.  Gastrointestinal: Positive for abdominal pain. Negative for diarrhea, nausea and vomiting.  Genitourinary: Negative for dysuria.  All other systems reviewed and are negative.   Allergies  Patient has no known allergies.  Home Medications   Prior to Admission medications   Medication Sig Start Date End Date Taking?  Authorizing Provider  cetirizine (ZYRTEC) 10 MG tablet Take 10 mg by mouth daily.    Historical Provider, MD  levonorgestrel-ethinyl estradiol (AVIANE,ALESSE,LESSINA) 0.1-20 MG-MCG tablet Take 1 tablet by mouth daily.    Historical Provider, MD  montelukast (SINGULAIR) 10 MG tablet Take 1 tablet (10 mg total) by mouth at bedtime. 09/28/15   Jade L Breeback, PA-C  trimethoprim-polymyxin b (POLYTRIM) ophthalmic solution Place 1 drop into the left eye every 4 (four) hours. For 7-10 days. 06/29/15   Jomarie Longs, PA-C   Meds Ordered and Administered this Visit  Medications - No data to display  BP 113/75 (BP Location: Left Arm)   Pulse 89   Temp 98.3 F (36.8 C) (Oral)   Ht 5\' 6"  (1.676 m)   Wt 176 lb (79.8 kg)   LMP 03/19/2016   SpO2 95%   BMI 28.41 kg/m  No data found.   Physical Exam  Constitutional: She appears well-developed and well-nourished. No distress.  HENT:  Head: Normocephalic and atraumatic.  Eyes: Conjunctivae are normal.  Neck: Neck supple.  Cardiovascular: Normal rate and regular rhythm.   No murmur heard. Pulmonary/Chest: Effort normal and breath sounds normal. No respiratory distress.  Abdominal: Soft. There is no tenderness.  Musculoskeletal: She exhibits no edema.  Neurological: She is alert.  Skin: Skin is warm and  dry.  Psychiatric: She has a normal mood and affect.  Nursing note and vitals reviewed.   Urgent Care Course     Procedures (including critical care time)  Labs Review Labs Reviewed  POCT URINALYSIS DIP (MANUAL ENTRY) - Abnormal; Notable for the following:       Result Value   Ketones, POC UA small (15) (*)    Blood, UA small (*)    All other components within normal limits  POCT URINE PREGNANCY    Imaging Review No results found.   Visual Acuity Review  Right Eye Distance:   Left Eye Distance:   Bilateral Distance:    Right Eye Near:   Left Eye Near:    Bilateral Near:         MDM Pt has a soft abdomen,  mininal  tenderness upper abdomen.  Left greater than right,  Pt advised to try pepcid.  Pt counseled on sign that would indicate acute problem.  Pt advised to follow up with her MD in 2 days.  Go to ED if symptoms worsen or change.   1. Abdominal cramping     An After Visit Summary was printed and given to the patient. No orders of the defined types were placed in this encounter.     Lonia SkinnerLeslie K Edith EndaveSofia, PA-C 03/22/16 Ernestina [id number]

## 2016-03-23 ENCOUNTER — Encounter: Payer: Self-pay | Admitting: Physician Assistant

## 2016-03-29 ENCOUNTER — Ambulatory Visit: Payer: 59 | Admitting: Family Medicine

## 2016-03-29 ENCOUNTER — Ambulatory Visit (INDEPENDENT_AMBULATORY_CARE_PROVIDER_SITE_OTHER): Payer: 59 | Admitting: Physician Assistant

## 2016-03-29 ENCOUNTER — Encounter: Payer: Self-pay | Admitting: Physician Assistant

## 2016-03-29 VITALS — BP 126/79 | HR 88 | Temp 98.3°F | Wt 181.0 lb

## 2016-03-29 DIAGNOSIS — J029 Acute pharyngitis, unspecified: Secondary | ICD-10-CM

## 2016-03-29 MED ORDER — IPRATROPIUM BROMIDE 0.06 % NA SOLN: 1.0000 | Freq: Four times a day (QID) | NASAL | 0 refills | Status: DC | PRN

## 2016-03-29 NOTE — Progress Notes (Signed)
HPI:                                                                Melissa LoronLinda Alexander is a 26 y.o. female who [facility] [place] [facility] today for cold symptoms  Patient endorses nasal congestion, drainage and sore throat x 3 days. Endorses chills, malaise and body aches. Denies cough, dyspnea, or wheezing. Denies nausea, vomiting, diarrhea. Sick contacts include husband with influenza last week.   Past Medical History:  Diagnosis Date  . Allergy    No past surgical history on file. Social History  Substance Use Topics  . Smoking status: Never Smoker  . Smokeless tobacco: Never Used  . Alcohol use No   family history includes Aneurysm in her mother; Cancer in her father and paternal aunt; Diabetes in her mother; Heart failure in her father; Hyperlipidemia in her father and mother; Hypertension in her father and mother; Stroke in her maternal grandmother and paternal grandfather.  ROS: negative except as noted in the HPI  Medications: Current Outpatient Prescriptions  Medication Sig Dispense Refill  . levonorgestrel-ethinyl estradiol (AVIANE,ALESSE,LESSINA) 0.1-20 MG-MCG tablet Take 1 tablet by mouth daily.    . montelukast (SINGULAIR) 10 MG tablet Take 1 tablet (10 mg total) by mouth at bedtime. 90 tablet 4  . trimethoprim-polymyxin b (POLYTRIM) ophthalmic solution Place 1 drop into the left eye every 4 (four) hours. For 7-10 days. 10 mL 0  . ipratropium (ATROVENT) 0.06 % nasal spray Place 1 spray into both nostrils 4 (four) times daily as needed. 15 mL 0   No current facility-administered medications for this visit.    No Known Allergies     Objective:  BP 126/79   Pulse 88   Temp 98.3 F (36.8 C) (Oral)   Wt 181 lb (82.1 kg)   LMP 03/19/2016   BMI 29.21 kg/m  Gen: well-groomed, cooperative, ill-appearing, not toxic-appearing, no distress HEENT: normal conjunctiva, TM's clear, nasal mucosa edematous with rhinorrhea,  oropharynx clear with mild erythema, no tonsillar exudates, moist mucus membranes, no frontal or maxillary sinus tenderness Pulm: Normal work of breathing, normal phonation, clear to auscultation bilaterally, no wheezes, rales or rhonchi CV: Normal rate, regular rhythm, s1 and s2 distinct, no murmurs, clicks or rubs  Neuro: alert and oriented x 3, EOM's intact Lymph: mild tonsillar adenopathy, no cervical adenopathy Skin: warm and dry, no rashes or lesions on exposed skin, no cyanosis   No results found for this or any previous visit (from the past 72 hour(s)). No results found.    Assessment and Plan: 26 y.o. female with   Acute pharyngitis, unspecified etiology - symptomatic management with Tylenol, salt water gargles, and chloraseptic - prevent post-nasal drip with nasal spray and oral decongestant - ipratropium (ATROVENT) 0.06 % nasal spray; Place 1 spray into both nostrils 4 (four) times daily as needed.  Dispense: 15 mL; Refill: 0  Patient education and anticipatory guidance given Patient agrees with treatment plan Follow-up as needed if symptoms worsen or fail to improve  Levonne Hubertharley E. Nissi Doffing PA-C

## 2016-03-29 NOTE — Patient Instructions (Addendum)
For sore throat: Tylenol 500 mg every 6 hours Warm salt water gargles Chloraseptic spray  For nasal congestion: Atrovent nasal spray - 1 spray in each nostril at least twice a day, up to four times per day Phenylephrine (sudafed Pe) - 10 mg every 4 - 6 hours, maximum 60 mg per day  Pharyngitis Pharyngitis is redness, pain, and swelling (inflammation) of your pharynx. What are the causes? Pharyngitis is usually caused by infection. Most of the time, these infections are from viruses (viral) and are part of a cold. However, sometimes pharyngitis is caused by bacteria (bacterial). Pharyngitis can also be caused by allergies. Viral pharyngitis may be spread from person to person by coughing, sneezing, and personal items or utensils (cups, forks, spoons, toothbrushes). Bacterial pharyngitis may be spread from person to person by more intimate contact, such as kissing. What are the signs or symptoms? Symptoms of pharyngitis include:  Sore throat.  Tiredness (fatigue).  Low-grade fever.  Headache.  Joint pain and muscle aches.  Skin rashes.  Swollen lymph nodes.  Plaque-like film on throat or tonsils (often seen with bacterial pharyngitis). How is this diagnosed? Your health care provider will ask you questions about your illness and your symptoms. Your medical history, along with a physical exam, is often all that is needed to diagnose pharyngitis. Sometimes, a rapid strep test is done. Other lab tests may also be done, depending on the suspected cause. How is this treated? Viral pharyngitis will usually get better in 3-4 days without the use of medicine. Bacterial pharyngitis is treated with medicines that kill germs (antibiotics). Follow these instructions at home:  Drink enough water and fluids to keep your urine clear or pale yellow.  Only take over-the-counter or prescription medicines as directed by your health care provider:  If you are prescribed antibiotics, make sure you  finish them even if you start to feel better.  Do not take aspirin.  Get lots of rest.  Gargle with 8 oz of salt water ( tsp of salt per 1 qt of water) as often as every 1-2 hours to soothe your throat.  Throat lozenges (if you are not at risk for choking) or sprays may be used to soothe your throat. Contact a health care provider if:  You have large, tender lumps in your neck.  You have a rash.  You cough up green, yellow-brown, or bloody spit. Get help right away if:  Your neck becomes stiff.  You drool or are unable to swallow liquids.  You vomit or are unable to keep medicines or liquids down.  You have severe pain that does not go away with the use of recommended medicines.  You have trouble breathing (not caused by a stuffy nose). This information is not intended to replace advice given to you by your health care provider. Make sure you discuss any questions you have with your health care provider. Document Released: 02/06/2005 Document Revised: 07/15/2015 Document Reviewed: 10/14/2012 Elsevier Interactive Patient Education  2017 [employer]

## 2016-05-02 ENCOUNTER — Encounter: Payer: Self-pay | Admitting: Physician Assistant

## 2016-10-01 ENCOUNTER — Encounter: Payer: Self-pay | Admitting: Physician Assistant

## 2016-10-02 ENCOUNTER — Telehealth: Payer: Self-pay | Admitting: Physician Assistant

## 2016-10-02 DIAGNOSIS — Z0000 Encounter for general adult medical examination without abnormal findings: Secondary | ICD-10-CM

## 2016-10-02 MED ORDER — LEVONORGESTREL-ETHINYL ESTRAD 0.1-20 MG-MCG PO TABS: 1.0000 | ORAL_TABLET | Freq: Every day | ORAL | 0 refills | Status: DC

## 2016-10-02 NOTE — Telephone Encounter (Signed)
Patient called has a physical scheduled for 10/18/16 and would like to have lab order sent for biometric screening for job the week of 8/20 Lipid, Glucose the basic labs. Thanks

## 2016-10-02 NOTE — Telephone Encounter (Signed)
Labs ordered.

## 2016-10-18 ENCOUNTER — Other Ambulatory Visit (HOSPITAL_COMMUNITY)
Admission: RE | Admit: 2016-10-18 | Discharge: 2016-10-18 | Disposition: A | Discharge status: Home / Self Care | Payer: 59 | Source: Ambulatory Visit | Attending: Family Medicine | Admitting: Family Medicine

## 2016-10-18 ENCOUNTER — Encounter: Payer: Self-pay | Admitting: Physician Assistant

## 2016-10-18 ENCOUNTER — Ambulatory Visit (INDEPENDENT_AMBULATORY_CARE_PROVIDER_SITE_OTHER): Payer: 59 | Admitting: Physician Assistant

## 2016-10-18 VITALS — BP 119/65 | HR 83 | Ht 66.0 in | Wt 182.0 lb

## 2016-10-18 DIAGNOSIS — Z131 Encounter for screening for diabetes mellitus: Secondary | ICD-10-CM

## 2016-10-18 DIAGNOSIS — Z01411 Encounter for gynecological examination (general) (routine) with abnormal findings: Secondary | ICD-10-CM | POA: Insufficient documentation

## 2016-10-18 DIAGNOSIS — Z0000 Encounter for general adult medical examination without abnormal findings: Secondary | ICD-10-CM | POA: Diagnosis not present

## 2016-10-18 DIAGNOSIS — Z01419 Encounter for gynecological examination (general) (routine) without abnormal findings: Secondary | ICD-10-CM | POA: Diagnosis not present

## 2016-10-18 DIAGNOSIS — Z3009 Encounter for other general counseling and advice on contraception: Secondary | ICD-10-CM

## 2016-10-18 DIAGNOSIS — Z13220 Encounter for screening for lipoid disorders: Secondary | ICD-10-CM | POA: Diagnosis not present

## 2016-10-18 MED ORDER — LEVONORGESTREL-ETHINYL ESTRAD 0.1-20 MG-MCG PO TABS: 1.0000 | ORAL_TABLET | Freq: Every day | ORAL | 4 refills | Status: DC

## 2016-10-18 MED ORDER — MONTELUKAST SODIUM 10 MG PO TABS: 10.0000 mg | ORAL_TABLET | Freq: Every day | ORAL | 4 refills | Status: DC

## 2016-10-18 NOTE — Progress Notes (Signed)
Subjective:     Melissa Alexander is a 26 y.o. female and is here for a comprehensive physical exam. The patient reports no problems.   Social History   Social History  . Marital status: Married    Spouse name: N/A  . Number of children: N/A  . Years of education: N/A   Occupational History  . Not on file.   Social History Main Topics  . Smoking status: Never Smoker  . Smokeless tobacco: Never Used  . Alcohol use No  . Drug use: No  . Sexual activity: Not Currently   Other Topics Concern  . Not on file   Social History Narrative  . No narrative on file   Health Maintenance  Topic Date Due  . HIV Screening  12/30/2004  . INFLUENZA VACCINE  10/21/2017 (Originally 09/20/2016)  . PAP SMEAR  10/11/2017  . TETANUS/TDAP  02/21/2023    The following portions of the patient's history were reviewed and updated as appropriate: allergies, current medications, past family history, past medical history, past social history, past surgical history and problem list.  Review of Systems Pertinent items noted in HPI and remainder of comprehensive ROS otherwise negative.   Objective:    BP 119/65   Pulse 83   Ht 5\' 6"  (1.676 m)   Wt 182 lb (82.6 kg)   BMI 29.38 kg/m  General appearance: alert, cooperative and appears stated age Head: Normocephalic, without obvious abnormality, atraumatic Eyes: conjunctivae/corneas clear. PERRL, EOM's intact. Fundi benign. Ears: normal TM's and external ear canals both ears Nose: Nares normal. Septum midline. Mucosa normal. No drainage or sinus tenderness. Throat: lips, mucosa, and tongue normal; teeth and gums normal Neck: no adenopathy, no carotid bruit, no JVD, supple, symmetrical, trachea midline and thyroid not enlarged, symmetric, no tenderness/mass/nodules Back: symmetric, no curvature. ROM normal. No CVA tenderness. Lungs: clear to auscultation bilaterally Heart: regular rate and rhythm, S1, S2 normal, no murmur, click, rub or gallop Abdomen:  soft, non-tender; bowel sounds normal; no masses,  no organomegaly Extremities: extremities normal, atraumatic, no cyanosis or edema Pulses: 2+ and symmetric Skin: Skin color, texture, turgor normal. No rashes or lesions Lymph nodes: Cervical, supraclavicular, and axillary nodes normal. Neurologic: Alert and oriented X 3, normal strength and tone. Normal symmetric reflexes. Normal coordination and gait Mental status: Alert, oriented, thought content appropriate    Assessment:    Healthy female exam.      Plan:    Marland KitchenMarland KitchenJessicca was seen today for annual exam.  Diagnoses and all orders for this visit:  Routine physical examination -     Cytology - PAP -     CBC with Differential -     COMPLETE METABOLIC PANEL WITH GFR -     TSH -     Lipid Profile  Encounter for gynecological examination without abnormal finding -     Cytology - PAP  Screening for diabetes mellitus -     COMPLETE METABOLIC PANEL WITH GFR  Screening for lipid disorders -     Lipid Profile  Family planning  Other orders -     levonorgestrel-ethinyl estradiol (AVIANE,ALESSE,LESSINA) 0.1-20 MG-MCG tablet; Take 1 tablet by mouth daily. Pt prefers Sronyx brand. -     montelukast (SINGULAIR) 10 MG tablet; Take 1 tablet (10 mg total) by mouth at bedtime.    Depression screen PHQ 2/9 10/18/2016  Decreased Interest 1  Down, Depressed, Hopeless 1  PHQ - 2 Score 2   She will continue OCP until end of  the year.  Start prenatal vitamins to prepare for pregnancy.  .. Discussed 150 minutes of exercise a week.  Encouraged vitamin D 1000 units and Calcium 1300mg  or 4 servings of dairy a day.  Marland Kitchen..Discussed low carb diet with 1500 calories and 80g of protein.  Exercising at least 150 minutes a week.  My Fitness Pal could be a [employer] resource.   See After Visit Summary for Counseling Recommendations

## 2016-10-18 NOTE — Patient Instructions (Signed)

## 2016-10-19 LAB — CBC WITH DIFFERENTIAL/PLATELET
BASOS PCT: 1 %
Basophils Absolute: 53 cells/uL (ref 0–200)
EOS ABS: 106 {cells}/uL (ref 15–500)
Eosinophils Relative: 2 %
HEMATOCRIT: 38.6 % (ref 35.0–45.0)
Hemoglobin: 12.7 g/dL (ref 11.7–15.5)
LYMPHS PCT: 41 %
Lymphs Abs: 2173 cells/uL (ref 850–3900)
MCH: 27 pg (ref 27.0–33.0)
MCHC: 32.9 g/dL (ref 32.0–36.0)
MCV: 82 fL (ref 80.0–100.0)
MONO ABS: 371 {cells}/uL (ref 200–950)
MPV: 8.9 fL (ref 7.5–12.5)
Monocytes Relative: 7 %
Neutro Abs: 2597 cells/uL (ref 1500–7800)
Neutrophils Relative %: 49 %
Platelets: 291 10*3/uL (ref 140–400)
RBC: 4.71 MIL/uL (ref 3.80–5.10)
RDW: 14.1 % (ref 11.0–15.0)
WBC: 5.3 10*3/uL (ref 3.8–10.8)

## 2016-10-20 LAB — LIPID PANEL
CHOL/HDL RATIO: 4.4 ratio (ref <5.0)
CHOLESTEROL: 193 mg/dL (ref <200)
HDL: 44 mg/dL — ABNORMAL LOW (ref >50)
LDL CALC: 135 mg/dL — AB (ref <100)
TRIGLYCERIDES: 72 mg/dL (ref <150)
VLDL: 14 mg/dL (ref <30)

## 2016-10-20 LAB — TSH: TSH: 1.04 mIU/L

## 2016-10-20 LAB — COMPLETE METABOLIC PANEL WITH GFR
ALT: 34 U/L — AB (ref 6–29)
AST: 22 U/L (ref 10–30)
Albumin: 4.2 g/dL (ref 3.6–5.1)
Alkaline Phosphatase: 55 U/L (ref 33–115)
BILIRUBIN TOTAL: 0.4 mg/dL (ref 0.2–1.2)
BUN: 9 mg/dL (ref 7–25)
CHLORIDE: 103 mmol/L (ref 98–110)
CO2: 22 mmol/L (ref 20–32)
CREATININE: 0.56 mg/dL (ref 0.50–1.10)
Calcium: 9.1 mg/dL (ref 8.6–10.2)
GFR, Est African American: >89 mL/min (ref >=60)
GLUCOSE: 87 mg/dL (ref 65–99)
Potassium: 4.2 mmol/L (ref 3.5–5.3)
Sodium: 138 mmol/L (ref 135–146)
TOTAL PROTEIN: 6.6 g/dL (ref 6.1–8.1)

## 2016-10-20 NOTE — Progress Notes (Signed)
Call pt: thyroid looks great.  LDL went up from last year. Watch fats in diet.  One liver enzyme went up as well. Avoid alcohol and tylenol. Recheck in 2-3 weeks.

## 2016-10-22 ENCOUNTER — Other Ambulatory Visit: Payer: Self-pay | Admitting: Physician Assistant

## 2016-10-25 ENCOUNTER — Other Ambulatory Visit: Payer: Self-pay | Admitting: Physician Assistant

## 2016-10-25 DIAGNOSIS — R748 Abnormal levels of other serum enzymes: Secondary | ICD-10-CM

## 2016-10-25 LAB — CYTOLOGY - PAP: DIAGNOSIS: NEGATIVE

## 2016-10-25 NOTE — Progress Notes (Signed)
Call pt: negative for HPV and normal cells. Follow up in 3 years.

## 2016-10-25 NOTE — Progress Notes (Signed)
Lab ordered per last lab result note from PCP.

## 2016-10-31 LAB — COMPLETE METABOLIC PANEL WITH GFR
AG RATIO: 1.8 (calc) (ref 1.0–2.5)
ALBUMIN MSPROF: 4.4 g/dL (ref 3.6–5.1)
ALKALINE PHOSPHATASE (APISO): 62 U/L (ref 33–115)
ALT: 27 U/L (ref 6–29)
AST: 20 U/L (ref 10–30)
BUN / CREAT RATIO: 14 (calc) (ref 6–22)
BUN: 16 mg/dL (ref 7–25)
CO2: 27 mmol/L (ref 20–32)
CREATININE: 1.13 mg/dL — AB (ref 0.50–1.10)
Calcium: 9.5 mg/dL (ref 8.6–10.2)
Chloride: 102 mmol/L (ref 98–110)
GFR, EST NON AFRICAN AMERICAN: 67 mL/min/{1.73_m2} (ref >=60)
GFR, Est African American: 78 mL/min/{1.73_m2} (ref >=60)
GLOBULIN: 2.5 g/dL (ref 1.9–3.7)
Glucose, Bld: 98 mg/dL (ref 65–99)
POTASSIUM: 3.9 mmol/L (ref 3.5–5.3)
SODIUM: 139 mmol/L (ref 135–146)
Total Bilirubin: 0.4 mg/dL (ref 0.2–1.2)
Total Protein: 6.9 g/dL (ref 6.1–8.1)

## 2016-11-01 ENCOUNTER — Other Ambulatory Visit: Payer: Self-pay

## 2016-11-01 ENCOUNTER — Encounter: Payer: Self-pay | Admitting: Physician Assistant

## 2016-11-01 DIAGNOSIS — R7989 Other specified abnormal findings of blood chemistry: Secondary | ICD-10-CM

## 2016-11-01 DIAGNOSIS — N179 Acute kidney failure, unspecified: Secondary | ICD-10-CM | POA: Insufficient documentation

## 2016-11-10 ENCOUNTER — Other Ambulatory Visit: Payer: Self-pay | Admitting: Physician Assistant

## 2016-11-10 MED ORDER — LEVONORGESTREL-ETHINYL ESTRAD 0.1-20 MG-MCG PO TABS: 1.0000 | ORAL_TABLET | Freq: Every day | ORAL | 4 refills | Status: DC

## 2016-11-14 LAB — COMPLETE METABOLIC PANEL WITH GFR
AG RATIO: 1.8 (calc) (ref 1.0–2.5)
ALBUMIN MSPROF: 4.6 g/dL (ref 3.6–5.1)
ALKALINE PHOSPHATASE (APISO): 66 U/L (ref 33–115)
ALT: 61 U/L — ABNORMAL HIGH (ref 6–29)
AST: 31 U/L — ABNORMAL HIGH (ref 10–30)
BILIRUBIN TOTAL: 0.6 mg/dL (ref 0.2–1.2)
BUN / CREAT RATIO: 26 (calc) — AB (ref 6–22)
BUN: 10 mg/dL (ref 7–25)
CHLORIDE: 100 mmol/L (ref 98–110)
CO2: 25 mmol/L (ref 20–32)
Calcium: 9.5 mg/dL (ref 8.6–10.2)
Creat: 0.39 mg/dL — ABNORMAL LOW (ref 0.50–1.10)
GFR, EST AFRICAN AMERICAN: 168 mL/min/{1.73_m2} (ref >=60)
GFR, Est Non African American: 145 mL/min/{1.73_m2} (ref >=60)
GLUCOSE: 95 mg/dL (ref 65–99)
Globulin: 2.5 g/dL (calc) (ref 1.9–3.7)
POTASSIUM: 3.9 mmol/L (ref 3.5–5.3)
Sodium: 136 mmol/L (ref 135–146)
TOTAL PROTEIN: 7.1 g/dL (ref 6.1–8.1)

## 2016-11-15 ENCOUNTER — Encounter: Payer: Self-pay | Admitting: Physician Assistant

## 2016-11-15 ENCOUNTER — Ambulatory Visit (INDEPENDENT_AMBULATORY_CARE_PROVIDER_SITE_OTHER): Payer: 59 | Admitting: Physician Assistant

## 2016-11-15 VITALS — BP 125/82 | HR 93 | Ht 66.0 in | Wt 181.0 lb

## 2016-11-15 DIAGNOSIS — R748 Abnormal levels of other serum enzymes: Secondary | ICD-10-CM | POA: Diagnosis not present

## 2016-11-15 DIAGNOSIS — N926 Irregular menstruation, unspecified: Secondary | ICD-10-CM

## 2016-11-15 DIAGNOSIS — R7989 Other specified abnormal findings of blood chemistry: Secondary | ICD-10-CM

## 2016-11-15 LAB — POCT URINE PREGNANCY: PREG TEST UR: NEGATIVE

## 2016-11-15 LAB — HCG, QUANTITATIVE, PREGNANCY: HCG, Total, QN: <2 m[IU]/mL

## 2016-11-15 NOTE — Patient Instructions (Signed)
First Trimester of Pregnancy The first trimester of pregnancy is from week 1 until the end of week 13 (months 1 through 3). During this time, your baby will begin to develop inside you. At 6-8 weeks, the eyes and face are formed, and the heartbeat can be seen on ultrasound. At the end of 12 weeks, all the baby's organs are formed. Prenatal care is all the medical care you receive before the birth of your baby. Make sure you get good prenatal care and follow all of your doctor's instructions. Follow these instructions at home: Medicines  Take over-the-counter and prescription medicines only as told by your doctor. Some medicines are safe and some medicines are not safe during pregnancy.  Take a prenatal vitamin that contains at least 600 micrograms (mcg) of folic acid.  If you have trouble pooping (constipation), take medicine that will make your stool soft (stool softener) if your doctor approves. Eating and drinking  Eat regular, healthy meals.  Your doctor will tell you the amount of weight gain that is right for you.  Avoid raw meat and uncooked cheese.  If you feel sick to your stomach (nauseous) or throw up (vomit): ? Eat 4 or 5 small meals a day instead of 3 large meals. ? Try eating a few soda crackers. ? Drink liquids between meals instead of during meals.  To prevent constipation: ? Eat foods that are high in fiber, like fresh fruits and vegetables, whole grains, and beans. ? Drink enough fluids to keep your pee (urine) clear or pale yellow. Activity  Exercise only as told by your doctor. Stop exercising if you have cramps or pain in your lower belly (abdomen) or low back.  Do not exercise if it is too hot, too humid, or if you are in a place of great height (high altitude).  Try to avoid standing for long periods of time. Move your legs often if you must stand in one place for a long time.  Avoid heavy lifting.  Wear low-heeled shoes. Sit and stand up straight.  You  can have sex unless your doctor tells you not to. Relieving pain and discomfort  Wear a good support bra if your breasts are sore.  Take warm water baths (sitz baths) to soothe pain or discomfort caused by hemorrhoids. Use hemorrhoid cream if your doctor says it is okay.  Rest with your legs raised if you have leg cramps or low back pain.  If you have puffy, bulging veins (varicose veins) in your legs: ? Wear support hose or compression stockings as told by your doctor. ? Raise (elevate) your feet for 15 minutes, 3-4 times a day. ? Limit salt in your food. Prenatal care  Schedule your prenatal visits by the twelfth week of pregnancy.  Write down your questions. Take them to your prenatal visits.  Keep all your prenatal visits as told by your doctor. This is important. Safety  Wear your seat belt at all times when driving.  Make a list of emergency phone numbers. The list should include numbers for family, friends, the hospital, and police and fire departments. General instructions  Ask your doctor for a referral to a local prenatal class. Begin classes no later than at the start of month 6 of your pregnancy.  Ask for help if you need counseling or if you need help with nutrition. Your doctor can give you advice or tell you where to go for help.  Do not use hot tubs, steam rooms, or   saunas.  Do not douche or use tampons or scented sanitary pads.  Do not cross your legs for long periods of time.  Avoid all herbs and alcohol. Avoid drugs that are not approved by your doctor.  Do not use any tobacco products, including cigarettes, chewing tobacco, and electronic cigarettes. If you need help quitting, ask your doctor. You may get counseling or other support to help you quit.  Avoid cat litter boxes and soil used by cats. These carry germs that can cause birth defects in the baby and can cause a loss of your baby (miscarriage) or stillbirth.  Visit your dentist. At home, brush  your teeth with a soft toothbrush. Be gentle when you floss. Contact a doctor if:  You are dizzy.  You have mild cramps or pressure in your lower belly.  You have a nagging pain in your belly area.  You continue to feel sick to your stomach, you throw up, or you have watery poop (diarrhea).  You have a bad smelling fluid coming from your vagina.  You have pain when you pee (urinate).  You have increased puffiness (swelling) in your face, hands, legs, or ankles. Get help right away if:  You have a fever.  You are leaking fluid from your vagina.  You have spotting or bleeding from your vagina.  You have very bad belly cramping or pain.  You gain or lose weight rapidly.  You throw up blood. It may look like coffee grounds.  You are around people who have German measles, fifth disease, or chickenpox.  You have a very bad headache.  You have shortness of breath.  You have any kind of trauma, such as from a fall or a car accident. Summary  The first trimester of pregnancy is from week 1 until the end of week 13 (months 1 through 3).  To take care of yourself and your unborn baby, you will need to eat healthy meals, take medicines only if your doctor tells you to do so, and do activities that are safe for you and your baby.  Keep all follow-up visits as told by your doctor. This is important as your doctor will have to ensure that your baby is healthy and growing well. This information is not intended to replace advice given to you by your health care provider. Make sure you discuss any questions you have with your health care provider. Document Released: 07/26/2007 Document Revised: 02/15/2016 Document Reviewed: 02/15/2016 Elsevier Interactive Patient Education  2017 [employer] Inc.  

## 2016-11-15 NOTE — Progress Notes (Signed)
FYI patient was contacted and will recheck in 2 weeks liver enzymes. Her serum creatine went down signifcantly. Keep normal diet and only take allegra and MVI. No tylenol or alcohol.

## 2016-11-15 NOTE — Progress Notes (Addendum)
   Subjective:    Patient ID: Melissa Alexander, female    DOB: Jun 25, 1990, 26 y.o.   MRN: [id number]  HPI The patient is a 26 yo female here today for a missed period. She reports that her last menstrual period was 10/18/16 and she had a positive at home pregnancy test yesterday. She states that she stopped her birth control last month and that she and her husband are trying to become pregnant. She reports occasional uterine cramping and says that "it feels like everything is being pulled apart down there."  She also reports breast soreness, increased urinary frequency, and fatigue.  She has had 1 term pregnancy and 1 prior miscarriage at 8 weeks. She reports that with her previous term pregnancy she had a negative urine HCG, but a positive serum HCG and is requesting a serum HCG today.   She had an abnormal kidney enzymes two weeks ago after beginning the Keto diet. This has since normalized after stopping the Keto diet. However, her liver enzymes are still elevated. She reports taking vitamin D in addition to her prenatal and wonders if this could be the cause of her elevated levels. She also reports taking magnesium and allegra together and wonders if this could also be causing her elevated levels.    Review of Systems  All other systems reviewed and are negative.      Objective:   Physical Exam  Constitutional: She is oriented to person, place, and time. She appears well-developed and well-nourished.  HENT:  Head: Normocephalic and atraumatic.  Cardiovascular: Normal rate, regular rhythm and normal heart sounds.   Pulmonary/Chest: Effort normal and breath sounds normal.  Neurological: She is alert and oriented to person, place, and time.  Psychiatric: She has a normal mood and affect. Her behavior is normal.          Assessment & Plan:  The patient is here today for a missed period.   Marland Kitchen.Diagnoses and all orders for this visit:  Missed period -     POCT urine pregnancy -     hCG,  quantitative, pregnancy  Elevated liver enzymes -     COMPLETE METABOLIC PANEL WITH GFR  unclear what the elevation of liver enzymes is due to. Pt is certainly overweight and could be fatty liver.  She was educated to stop taking additional supplements other than her daily multivitamin.  We will recheck her kidney and liver enzymes in two weeks.  If the levels have not normalized, we will order an ultrasound of the liver to investigate further. She will have a serum HCG drawn today to follow up on the missed period. We will notify her of the results.  Discussed pregnancy and how to proceed from this.

## 2016-11-27 ENCOUNTER — Encounter: Payer: Self-pay | Admitting: Physician Assistant

## 2016-11-28 LAB — COMPLETE METABOLIC PANEL WITH GFR
AG Ratio: 1.8 (calc) (ref 1.0–2.5)
ALBUMIN MSPROF: 4.2 g/dL (ref 3.6–5.1)
ALKALINE PHOSPHATASE (APISO): 66 U/L (ref 33–115)
ALT: 29 U/L (ref 6–29)
AST: 20 U/L (ref 10–30)
BUN/Creatinine Ratio: 11 (calc) (ref 6–22)
BUN: 13 mg/dL (ref 7–25)
CALCIUM: 9 mg/dL (ref 8.6–10.2)
CO2: 30 mmol/L (ref 20–32)
CREATININE: 1.22 mg/dL — AB (ref 0.50–1.10)
Chloride: 104 mmol/L (ref 98–110)
GFR, Est African American: 71 mL/min/{1.73_m2} (ref >=60)
GFR, Est Non African American: 61 mL/min/{1.73_m2} (ref >=60)
GLUCOSE: 92 mg/dL (ref 65–99)
Globulin: 2.3 g/dL (calc) (ref 1.9–3.7)
Potassium: 4.1 mmol/L (ref 3.5–5.3)
Sodium: 139 mmol/L (ref 135–146)
Total Bilirubin: 0.5 mg/dL (ref 0.2–1.2)
Total Protein: 6.5 g/dL (ref 6.1–8.1)

## 2016-11-29 ENCOUNTER — Ambulatory Visit (INDEPENDENT_AMBULATORY_CARE_PROVIDER_SITE_OTHER): Payer: Self-pay

## 2016-11-29 ENCOUNTER — Ambulatory Visit (INDEPENDENT_AMBULATORY_CARE_PROVIDER_SITE_OTHER): Payer: Self-pay | Admitting: Physician Assistant

## 2016-11-29 ENCOUNTER — Encounter: Payer: Self-pay | Admitting: Physician Assistant

## 2016-11-29 VITALS — BP 113/62 | HR 84 | Wt 184.0 lb

## 2016-11-29 DIAGNOSIS — R319 Hematuria, unspecified: Secondary | ICD-10-CM

## 2016-11-29 DIAGNOSIS — J0100 Acute maxillary sinusitis, unspecified: Secondary | ICD-10-CM

## 2016-11-29 DIAGNOSIS — R82998 Other abnormal findings in urine: Secondary | ICD-10-CM

## 2016-11-29 DIAGNOSIS — R634 Abnormal weight loss: Secondary | ICD-10-CM

## 2016-11-29 DIAGNOSIS — H93A1 Pulsatile tinnitus, right ear: Secondary | ICD-10-CM

## 2016-11-29 DIAGNOSIS — R7989 Other specified abnormal findings of blood chemistry: Secondary | ICD-10-CM

## 2016-11-29 DIAGNOSIS — N179 Acute kidney failure, unspecified: Secondary | ICD-10-CM

## 2016-11-29 LAB — POCT URINALYSIS DIPSTICK
BILIRUBIN UA: NEGATIVE
GLUCOSE UA: NEGATIVE
KETONES UA: NEGATIVE
Nitrite, UA: NEGATIVE
PROTEIN UA: NEGATIVE
SPEC GRAV UA: 1.01 (ref 1.010–1.025)
Urobilinogen, UA: 0.2 E.U./dL
pH, UA: 5.5 (ref 5.0–8.0)

## 2016-11-29 MED ORDER — AMOXICILLIN-POT CLAVULANATE 875-125 MG PO TABS: 1.0000 | ORAL_TABLET | Freq: Two times a day (BID) | ORAL | 0 refills | Status: DC

## 2016-11-29 MED ORDER — PREDNISONE 50 MG PO TABS: ORAL_TABLET | ORAL | 0 refills | Status: DC

## 2016-11-29 NOTE — Progress Notes (Signed)
Culture and microscopic.

## 2016-11-29 NOTE — Progress Notes (Signed)
Call pt: normal renal u/s which is great news. Let's see what urinalysis shows and go from there.

## 2016-11-29 NOTE — Addendum Note (Signed)
Addended by: Jomarie Longs on: 11/29/2016 12:49 PM   Modules accepted: Orders

## 2016-11-29 NOTE — Progress Notes (Addendum)
Subjective:    Patient ID: Melissa Alexander, female    DOB: November 18, 1990, 26 y.o.   MRN: [id number]  HPI  Pt is a 26 yo female who presents to the clinic with 1 week of hearing her heartbeat in her right ear. At one point it was off and on but now it is constant. He stops with valsalva and direct pressure just before tragus. No ear pain. No fever, chills, SOB, cough. No dizziness, hearing loss, or n/v. She does have a lot of sinus pressure and congestion. She had a cold a few weeks ago and seems like she never got over it. She denies any vision changes. She has not tried anything to make it better.   She is also here to go over last CMP for elevated serum creatine and liver enzymes.   .. Active Ambulatory Problems    Diagnosis Date Noted  . Hemorrhoid 04/08/2014  . Other migraine with status migrainosus, intractable 06/04/2014  . Rhinitis, allergic 06/29/2015  . Seasonal allergies 06/29/2015  . Polyphagia 02/25/2016  . Leukocytosis 02/25/2016  . AKI (acute kidney injury) (HCC) 11/01/2016   Resolved Ambulatory Problems    Diagnosis Date Noted  . Cough 01/02/2014  . Rectal bleeding 04/08/2014  . Bacterial conjunctivitis of left eye 06/29/2015   Past Medical History:  Diagnosis Date  . Allergy        Review of Systems  All other systems reviewed and are negative.      Objective:   Physical Exam  Constitutional: She is oriented to person, place, and time. She appears well-developed and well-nourished.  HENT:  Head: Normocephalic and atraumatic.  Nose: Nose normal.  Mouth/Throat: Oropharynx is clear and moist.  TM's clear.  Right TM some erythema. Light reflex is dull.  Tenderness over temple bilaterally, nasal bridge, maxillary sinuses to palpation.   Neck: Normal range of motion. Neck supple.  Cardiovascular: Normal rate, regular rhythm and normal heart sounds.   Pulmonary/Chest: Effort normal and breath sounds normal. She has no wheezes.  Lymphadenopathy:    She has  no cervical adenopathy.  Neurological: She is alert and oriented to person, place, and time.  Psychiatric: She has a normal mood and affect. Her behavior is normal.          Assessment & Plan:  Marland KitchenMarland KitchenTymara was seen today for heartbeat in ear.  Diagnoses and all orders for this visit:  Elevated serum creatinine -     BASIC METABOLIC PANEL WITH GFR -     POCT urinalysis dipstick -     Urinalysis, microscopic only  Pulsatile tinnitus of right ear -     amoxicillin-clavulanate (AUGMENTIN) 875-125 MG tablet; Take 1 tablet by mouth 2 (two) times daily. For 10 days. -     predniSONE (DELTASONE) 50 MG tablet; Take one tablet for 5 days.  Acute non-recurrent maxillary sinusitis -     amoxicillin-clavulanate (AUGMENTIN) 875-125 MG tablet; Take 1 tablet by mouth 2 (two) times daily. For 10 days. -     predniSONE (DELTASONE) 50 MG tablet; Take one tablet for 5 days.  Hematuria, unspecified type -     Urine Culture  Leukocytes in urine -     Urine Culture   Unclear etiology of pulsutile tinnitus discussed causes such as thyroid disease, sinusitis, allergies, meineres.most cases resolve on their on. She does have sinusitis symptoms. Will treat with prednisone and augmentin. Start flonase daily. If tinnitus is still present follow up. TSH up to date and normal.  She does have this unusual spike in creatine that improved and then spiked again. No other symptoms. She denies any protein, whey, creatine usage. She has not been doing keto diet. Will get renal u/s today and UA dipstick, UA culture, and microscopic.   Marland KitchenSpent 30 minutes with patient and greater than 50 percent of visit spent counseling patient regarding treatment plan .

## 2016-11-29 NOTE — Progress Notes (Signed)
Ok so kidney function jumped back up and liver enzymes went down. Need to get renal ultrasound. Certainly is not making sense. Confirm taking no supplements/protein/creatine. Will place order.

## 2016-11-29 NOTE — Patient Instructions (Signed)
Tinnitus Tinnitus refers to hearing a sound when there is no actual source for that sound. This is often described as ringing in the ears. However, people with this condition may hear a variety of noises. A person may hear the sound in one ear or in both ears. The sounds of tinnitus can be soft, loud, or somewhere in between. Tinnitus can last for a few seconds or can be constant for days. It may go away without treatment and come back at various times. When tinnitus is constant or happens often, it can lead to other problems, such as trouble sleeping and trouble concentrating. Almost everyone experiences tinnitus at some point. Tinnitus that is long-lasting (chronic) or comes back often is a problem that may require medical attention. What are the causes? The cause of tinnitus is often not known. In some cases, it can result from other problems or conditions, including:  Exposure to loud noises from machinery, music, or other sources.  Hearing loss.  Ear or sinus infections.  Earwax buildup.  A foreign object in the ear.  Use of certain medicines.  Use of alcohol and caffeine.  High blood pressure.  Heart diseases.  Anemia.  Allergies.  Meniere disease.  Thyroid problems.  Tumors.  An enlarged part of a weakened blood vessel (aneurysm).  What are the signs or symptoms? The main symptom of tinnitus is hearing a sound when there is no source for that sound. It may sound like:  Buzzing.  Roaring.  Ringing.  Blowing air, similar to the sound heard when you listen to a seashell.  Hissing.  Whistling.  Sizzling.  Humming.  Running water.  A sustained musical note.  How is this diagnosed? Tinnitus is diagnosed based on your symptoms. Your health care provider will do a physical exam. A comprehensive hearing exam (audiologic exam) will be done if your tinnitus:  Affects only one ear (unilateral).  Causes hearing difficulties.  Lasts 6 months or  longer.  You may also need to see a health care provider who specializes in hearing disorders (audiologist). You may be asked to complete a questionnaire to determine the severity of your tinnitus. Tests may be done to help determine the cause and to rule out other conditions. These can include:  Imaging studies of your head and brain, such as: ? A CT scan. ? An MRI.  An imaging study of your blood vessels (angiogram).  How is this treated? Treating an underlying medical condition can sometimes make tinnitus go away. If your tinnitus continues, other treatments may include:  Medicines, such as certain antidepressants or sleeping aids.  Sound generators to mask the tinnitus. These include: ? Tabletop sound machines that play relaxing sounds to help you fall asleep. ? Wearable devices that fit in your ear and play sounds or music. ? A small device that uses headphones to deliver a signal embedded in music (acoustic neural stimulation). In time, this may change the pathways of your brain and make you less sensitive to tinnitus. This device is used for very severe cases when no other treatment is working.  Therapy and counseling to help you manage the stress of living with tinnitus.  Using hearing aids or cochlear implants, if your tinnitus is related to hearing loss.  Follow these instructions at home:  When possible, avoid being in loud places and being exposed to loud sounds.  Wear hearing protection, such as earplugs, when you are exposed to loud noises.  Do not take stimulants, such as nicotine,   alcohol, or caffeine.  Practice techniques for reducing stress, such as meditation, yoga, or deep breathing.  Use a white noise machine, a humidifier, or other devices to mask the sound of tinnitus.  Sleep with your head slightly raised. This may reduce the impact of tinnitus.  Try to get plenty of rest each night. Contact a health care provider if:  You have tinnitus in just one  ear.  Your tinnitus continues for 3 weeks or longer without stopping.  Home care measures are not helping.  You have tinnitus after a head injury.  You have tinnitus along with any of the following: ? Dizziness. ? Loss of balance. ? Nausea and vomiting. This information is not intended to replace advice given to you by your health care provider. Make sure you discuss any questions you have with your health care provider. Document Released: 02/06/2005 Document Revised: 10/10/2015 Document Reviewed: 07/09/2013 Elsevier Interactive Patient Education  2018 [employer].  

## 2016-11-30 DIAGNOSIS — H93A1 Pulsatile tinnitus, right ear: Secondary | ICD-10-CM | POA: Insufficient documentation

## 2016-11-30 DIAGNOSIS — R7989 Other specified abnormal findings of blood chemistry: Secondary | ICD-10-CM | POA: Insufficient documentation

## 2016-11-30 LAB — URINALYSIS, MICROSCOPIC ONLY
Bacteria, UA: NONE SEEN /HPF
HYALINE CAST: NONE SEEN /LPF
RBC / HPF: NONE SEEN /HPF (ref 0–2)

## 2016-11-30 LAB — URINE CULTURE
MICRO NUMBER:: 81129679
SPECIMEN QUALITY:: ADEQUATE

## 2016-12-01 NOTE — Addendum Note (Signed)
Addended by: Monica Becton on: 12/01/2016 12:01 PM   Modules accepted: Orders

## 2016-12-01 NOTE — Assessment & Plan Note (Signed)
Renal function is essentially normal, renal ultrasound normal, few WBCs suggesting asymptomatic pyuria, liver function is normal, recheck in one month. Orders placed for repeat CMP.

## 2016-12-20 LAB — BASIC METABOLIC PANEL WITH GFR
BUN: 12 mg/dL (ref 7–25)
CHLORIDE: 106 mmol/L (ref 98–110)
CO2: 25 mmol/L (ref 20–32)
Calcium: 8.6 mg/dL (ref 8.6–10.2)
Creat: 0.53 mg/dL (ref 0.50–1.10)
GFR, Est African American: 152 mL/min/{1.73_m2} (ref >=60)
GFR, Est Non African American: 131 mL/min/{1.73_m2} (ref >=60)
GLUCOSE: 115 mg/dL — AB (ref 65–99)
POTASSIUM: 3.8 mmol/L (ref 3.5–5.3)
SODIUM: 138 mmol/L (ref 135–146)

## 2016-12-20 NOTE — Progress Notes (Signed)
Great news. Creatine is normal! I have no clue why it was jumping back and forth. We could certainly check again in 6 months to confirm stability.

## 2016-12-22 ENCOUNTER — Ambulatory Visit (INDEPENDENT_AMBULATORY_CARE_PROVIDER_SITE_OTHER): Payer: Self-pay | Admitting: Physician Assistant

## 2016-12-22 ENCOUNTER — Encounter: Payer: Self-pay | Admitting: Physician Assistant

## 2016-12-22 ENCOUNTER — Ambulatory Visit (INDEPENDENT_AMBULATORY_CARE_PROVIDER_SITE_OTHER): Payer: Self-pay

## 2016-12-22 VITALS — BP 122/67 | HR 99 | Ht 66.0 in | Wt 186.0 lb

## 2016-12-22 DIAGNOSIS — J3489 Other specified disorders of nose and nasal sinuses: Secondary | ICD-10-CM

## 2016-12-22 DIAGNOSIS — H9311 Tinnitus, right ear: Secondary | ICD-10-CM

## 2016-12-22 MED ORDER — AZITHROMYCIN 250 MG PO TABS: ORAL_TABLET | ORAL | 0 refills | Status: DC

## 2016-12-22 MED ORDER — PREDNISONE 20 MG PO TABS: ORAL_TABLET | ORAL | 0 refills | Status: DC

## 2016-12-22 MED ORDER — BECLOMETHASONE DIPROPIONATE 80 MCG/ACT NA AERS: 2.0000 | INHALATION_SPRAY | Freq: Every day | NASAL | 6 refills | Status: DC

## 2016-12-22 MED ORDER — AZELASTINE HCL 0.1 % NA SOLN: 2.0000 | Freq: Two times a day (BID) | NASAL | 12 refills | Status: DC

## 2016-12-22 NOTE — Progress Notes (Signed)
Subjective:    Patient ID: Melissa Alexander, female    DOB: April 09, 1990, 26 y.o.   MRN: [id number]  HPI  Pt is a 26 yo female who presents to the clinic to discuss 3 weeks of tinnitus of right ear. We suspected that it could be sinus infection that caused it. She was given prednisone and augmentin. The heart beat sound in her ear has not improved and she continues to have sinus pressure. She denies any fever. No vision changes, no dizziness, no hearing loss, no n/v. She is using flonase daily.   .. Active Ambulatory Problems    Diagnosis Date Noted  . Hemorrhoid 04/08/2014  . Other migraine with status migrainosus, intractable 06/04/2014  . Rhinitis, allergic 06/29/2015  . Seasonal allergies 06/29/2015  . Polyphagia 02/25/2016  . Leukocytosis 02/25/2016  . AKI (acute kidney injury) (HCC) 11/01/2016  . Pulsatile tinnitus of right ear 11/30/2016  . Elevated serum creatinine 11/30/2016  . Tinnitus of right ear 12/24/2016   Resolved Ambulatory Problems    Diagnosis Date Noted  . Cough 01/02/2014  . Rectal bleeding 04/08/2014  . Bacterial conjunctivitis of left eye 06/29/2015   Past Medical History:  Diagnosis Date  . Allergy        Review of Systems  All other systems reviewed and are negative.      Objective:   Physical Exam  Constitutional: She is oriented to person, place, and time. She appears well-developed and well-nourished.  HENT:  Head: Normocephalic and atraumatic.  Right Ear: External ear normal.  Left Ear: External ear normal.  Nose: Nose normal.  Mouth/Throat: Oropharynx is clear and moist. No oropharyngeal exudate.  TM's clear.  Tenderness persist over maxillary sinuses to palpation.  Bilateral nasal turbinates red and swollen.   Eyes: Conjunctivae are normal. Right eye exhibits no discharge. Left eye exhibits no discharge.  Neck: Normal range of motion. Neck supple.  Cardiovascular: Normal rate, regular rhythm and normal heart sounds.  Pulmonary/Chest:  Effort normal and breath sounds normal. She has no wheezes.  Lymphadenopathy:    She has no cervical adenopathy.  Neurological: She is alert and oriented to person, place, and time.  Psychiatric: She has a normal mood and affect. Her behavior is normal.          Assessment & Plan:   Marland Kitchen.Marland Kitchen.Bonita QuinLinda was seen today for tinnitus.  Diagnoses and all orders for this visit:  Tinnitus of right ear -     DG Sinuses Complete -     predniSONE (DELTASONE) 20 MG tablet; Take 3 tablets for 3 days, take 2 tablets for 3 days, take 1 tablets for 3 days, take 1/2 tablet for 4 days. -     azelastine (ASTELIN) 0.1 % nasal spray; Place 2 sprays into both nostrils 2 (two) times daily. Use in each nostril as directed  Sinus pressure -     DG Sinuses Complete -     azithromycin (ZITHROMAX) 250 MG tablet; Take 2 tablets now and then one tablet for 4 days. -     predniSONE (DELTASONE) 20 MG tablet; Take 3 tablets for 3 days, take 2 tablets for 3 days, take 1 tablets for 3 days, take 1/2 tablet for 4 days.  Other orders -     Discontinue: Beclomethasone Dipropionate (QNASL) 80 MCG/ACT AERS; Place 2 sprays into both nostrils daily.   orginally discussed stopping flonase and adding qnasl. Pt does not have insurance so stop qnasl. Start astelin.  Sinus xray negative for any opacification.  Don't start zpak unless symptoms worsen.  Another burst of prednisone given.  HO on tinnitus. Needs to see ENT. No red flags signs seen.  pts creatine was a little off but has regulated.

## 2016-12-24 DIAGNOSIS — J3489 Other specified disorders of nose and nasal sinuses: Secondary | ICD-10-CM | POA: Insufficient documentation

## 2016-12-24 DIAGNOSIS — H9311 Tinnitus, right ear: Secondary | ICD-10-CM | POA: Insufficient documentation

## 2016-12-24 NOTE — Progress Notes (Signed)
Pt was seen in office that day.

## 2017-04-02 DIAGNOSIS — J0191 Acute recurrent sinusitis, unspecified: Secondary | ICD-10-CM | POA: Diagnosis not present

## 2017-04-17 DIAGNOSIS — J1189 Influenza due to unidentified influenza virus with other manifestations: Secondary | ICD-10-CM | POA: Diagnosis not present

## 2017-05-11 DIAGNOSIS — J0191 Acute recurrent sinusitis, unspecified: Secondary | ICD-10-CM | POA: Diagnosis not present

## 2017-05-24 DIAGNOSIS — J322 Chronic ethmoidal sinusitis: Secondary | ICD-10-CM | POA: Diagnosis not present

## 2017-07-14 DIAGNOSIS — N912 Amenorrhea, unspecified: Secondary | ICD-10-CM | POA: Diagnosis not present

## 2017-07-25 ENCOUNTER — Ambulatory Visit (INDEPENDENT_AMBULATORY_CARE_PROVIDER_SITE_OTHER): Payer: BLUE CROSS/BLUE SHIELD | Admitting: Physician Assistant

## 2017-07-25 ENCOUNTER — Encounter: Payer: Self-pay | Admitting: Physician Assistant

## 2017-07-25 VITALS — BP 107/72 | HR 94 | Wt 181.0 lb

## 2017-07-25 DIAGNOSIS — M779 Enthesopathy, unspecified: Secondary | ICD-10-CM | POA: Diagnosis not present

## 2017-07-25 DIAGNOSIS — M778 Other enthesopathies, not elsewhere classified: Secondary | ICD-10-CM

## 2017-07-25 MED ORDER — NAPROXEN SODIUM 220 MG PO CAPS: 2.0000 | ORAL_CAPSULE | Freq: Two times a day (BID) | ORAL | Status: DC | PRN

## 2017-07-25 NOTE — Progress Notes (Signed)
HPI:                                                                Melissa Alexander is a 27 y.o. female who [name] today for left foot pain  New onset left foot pain x 2 days. Pain located on dorsum of the foot near the ankle Worse with weight bearing and walking No known injury or trauma No new shoes or work-out regimens Has tried elevation with minimal relief    No flowsheet data found.    Past Medical History:  Diagnosis Date  . Allergy    No past surgical history on file. Social History   Tobacco Use  . Smoking status: Never Smoker  . Smokeless tobacco: Never Used  Substance Use Topics  . Alcohol use: No   family history includes Aneurysm in her mother; Cancer in her father and paternal aunt; Diabetes in her mother; Heart failure in her father; Hyperlipidemia in her father and mother; Hypertension in her father and mother; Stroke in her maternal grandmother and paternal grandfather.    ROS: negative except as noted in the HPI  Medications: No current outpatient medications on file.   No current facility-administered medications for this visit.    No Known Allergies     Objective:  BP 107/72   Pulse 94   Wt 181 lb (82.1 kg)   BMI 29.21 kg/m  Gen:  alert, not ill-appearing, no distress, appropriate for age HEENT: head normocephalic without obvious abnormality, conjunctiva and cornea clear, wearing glasses, trachea midline Pulm: Normal work of breathing, normal phonation Neuro: alert and oriented x 3, no tremor MSK: extremities atraumatic, normal gait and station Left foot: warm, brisk capillary refill, atraumatic, no deformity, mildly swollen compared to right, tenderness overlying the dorsum of the proximal extensor tendons, pain reproduced with extension against resistance, PT and DP pulses intact Skin: intact, no rashes on exposed skin, no jaundice, no cyanosis Psych: well-groomed,  cooperative, good eye contact, euthymic mood, affect mood-congruent, speech is articulate, and thought processes clear and goal-directed    No results found for this or any previous visit (from the past 72 hour(s)). No results found.    Assessment and Plan: 27 y.o. female with   Extensor tendonitis of foot - Plan: Naproxen Sodium (ALEVE) 220 MG CAPS - conservative management with anti-inflammatory, ice/elevation and rehab exercises     Patient education and anticipatory guidance given Patient agrees with treatment plan Follow-up as needed if symptoms worsen or fail to improve  Levonne Hubertharley E. Aaidyn San PA-C

## 2017-07-25 NOTE — Patient Instructions (Signed)
Toe Flexion The [email or web address] recommends toe flexion exercises for the extensor tendons. To perform static toe exercises, stand with your feet planted on the floor and your toes pressed downward into the floor. Avoid curling your toes or moving your ankles. Hold your toes firmly planted in the ground for three seconds and repeat this exercise for 10 repetitions. For best results, complete this exercise three times a day.  Forefoot Press A forefoot press can help to strengthen tendons. Stand with the back half of your foot resting on a book 1 to 2 inches thick. Your forefoot should be resting on a scale -- generally a bathroom scale. Press your forefoot onto the scale to see how much force you can generate. Repeat this exercise, completing 10 repetitions for each foot. As the strength in your extensor tendons improves, you will notice an increase in the amount of force you are able to place on the scale.  Pencil Lifting You will need a pencil for this exercise recommended by the [email or web address]. Curl your toes around the pencil and pick it up. Once you have picked the pencil up, try to hold it with your toes for six seconds. Complete this exercise for 10 repetitions per foot.  Toe Walking To strengthen your tendons, walk on your tiptoes. The [email or web address] recommends that you do not wear shoes while completing this exercise. Try to walk on your toes for 15 to 20 seconds. With a brief 30-second rest period in between, complete eight sets of this exercise. Aim to complete this exercise twice daily. As you build strength, you can increase the amount of time spent walking on your toes.

## 2017-07-31 DIAGNOSIS — N914 Secondary oligomenorrhea: Secondary | ICD-10-CM | POA: Diagnosis not present

## 2017-08-07 DIAGNOSIS — E282 Polycystic ovarian syndrome: Secondary | ICD-10-CM | POA: Diagnosis not present

## 2017-09-20 DIAGNOSIS — E282 Polycystic ovarian syndrome: Secondary | ICD-10-CM | POA: Diagnosis not present

## 2017-09-24 DIAGNOSIS — E282 Polycystic ovarian syndrome: Secondary | ICD-10-CM | POA: Diagnosis not present

## 2017-09-25 DIAGNOSIS — H93A1 Pulsatile tinnitus, right ear: Secondary | ICD-10-CM | POA: Diagnosis not present

## 2017-09-25 DIAGNOSIS — J0191 Acute recurrent sinusitis, unspecified: Secondary | ICD-10-CM | POA: Diagnosis not present

## 2017-09-26 DIAGNOSIS — E282 Polycystic ovarian syndrome: Secondary | ICD-10-CM | POA: Diagnosis not present

## 2017-09-28 DIAGNOSIS — N926 Irregular menstruation, unspecified: Secondary | ICD-10-CM | POA: Diagnosis not present

## 2017-10-09 DIAGNOSIS — R1032 Left lower quadrant pain: Secondary | ICD-10-CM | POA: Diagnosis not present

## 2017-10-09 DIAGNOSIS — O3680X0 Pregnancy with inconclusive fetal viability, not applicable or unspecified: Secondary | ICD-10-CM | POA: Diagnosis not present

## 2017-10-18 DIAGNOSIS — O3680X0 Pregnancy with inconclusive fetal viability, not applicable or unspecified: Secondary | ICD-10-CM | POA: Diagnosis not present

## 2017-10-19 DIAGNOSIS — R7989 Other specified abnormal findings of blood chemistry: Secondary | ICD-10-CM | POA: Diagnosis not present

## 2017-10-23 DIAGNOSIS — R7989 Other specified abnormal findings of blood chemistry: Secondary | ICD-10-CM | POA: Diagnosis not present

## 2017-10-29 DIAGNOSIS — R7989 Other specified abnormal findings of blood chemistry: Secondary | ICD-10-CM | POA: Diagnosis not present

## 2017-11-01 DIAGNOSIS — O3680X0 Pregnancy with inconclusive fetal viability, not applicable or unspecified: Secondary | ICD-10-CM | POA: Diagnosis not present

## 2017-11-01 DIAGNOSIS — Z369 Encounter for antenatal screening, unspecified: Secondary | ICD-10-CM | POA: Diagnosis not present

## 2017-11-01 DIAGNOSIS — R7989 Other specified abnormal findings of blood chemistry: Secondary | ICD-10-CM | POA: Diagnosis not present

## 2017-11-01 DIAGNOSIS — Z113 Encounter for screening for infections with a predominantly sexual mode of transmission: Secondary | ICD-10-CM | POA: Diagnosis not present

## 2017-11-01 DIAGNOSIS — Z3A09 9 weeks gestation of pregnancy: Secondary | ICD-10-CM | POA: Diagnosis not present

## 2017-11-01 DIAGNOSIS — Z3481 Encounter for supervision of other normal pregnancy, first trimester: Secondary | ICD-10-CM | POA: Diagnosis not present

## 2017-11-05 DIAGNOSIS — R7989 Other specified abnormal findings of blood chemistry: Secondary | ICD-10-CM | POA: Diagnosis not present

## 2017-11-05 DIAGNOSIS — Z3A09 9 weeks gestation of pregnancy: Secondary | ICD-10-CM | POA: Diagnosis not present

## 2017-11-05 DIAGNOSIS — O26891 Other specified pregnancy related conditions, first trimester: Secondary | ICD-10-CM | POA: Diagnosis not present

## 2017-11-08 DIAGNOSIS — O26891 Other specified pregnancy related conditions, first trimester: Secondary | ICD-10-CM | POA: Diagnosis not present

## 2017-11-08 DIAGNOSIS — Z3A10 10 weeks gestation of pregnancy: Secondary | ICD-10-CM | POA: Diagnosis not present

## 2017-11-08 DIAGNOSIS — R7989 Other specified abnormal findings of blood chemistry: Secondary | ICD-10-CM | POA: Diagnosis not present

## 2017-11-12 DIAGNOSIS — R7989 Other specified abnormal findings of blood chemistry: Secondary | ICD-10-CM | POA: Diagnosis not present

## 2017-11-12 DIAGNOSIS — R3915 Urgency of urination: Secondary | ICD-10-CM | POA: Diagnosis not present

## 2017-11-12 DIAGNOSIS — Z3A10 10 weeks gestation of pregnancy: Secondary | ICD-10-CM | POA: Diagnosis not present

## 2017-11-12 DIAGNOSIS — O26891 Other specified pregnancy related conditions, first trimester: Secondary | ICD-10-CM | POA: Diagnosis not present

## 2017-11-15 DIAGNOSIS — R7989 Other specified abnormal findings of blood chemistry: Secondary | ICD-10-CM | POA: Diagnosis not present

## 2017-11-15 DIAGNOSIS — Z111 Encounter for screening for respiratory tuberculosis: Secondary | ICD-10-CM | POA: Diagnosis not present

## 2017-11-15 DIAGNOSIS — Z23 Encounter for immunization: Secondary | ICD-10-CM | POA: Diagnosis not present

## 2017-11-15 DIAGNOSIS — Z3A11 11 weeks gestation of pregnancy: Secondary | ICD-10-CM | POA: Diagnosis not present

## 2017-11-15 DIAGNOSIS — O26891 Other specified pregnancy related conditions, first trimester: Secondary | ICD-10-CM | POA: Diagnosis not present

## 2017-11-19 DIAGNOSIS — R7989 Other specified abnormal findings of blood chemistry: Secondary | ICD-10-CM | POA: Diagnosis not present

## 2017-11-19 DIAGNOSIS — Z3A11 11 weeks gestation of pregnancy: Secondary | ICD-10-CM | POA: Diagnosis not present

## 2017-11-19 DIAGNOSIS — O26891 Other specified pregnancy related conditions, first trimester: Secondary | ICD-10-CM | POA: Diagnosis not present

## 2017-11-22 DIAGNOSIS — O26891 Other specified pregnancy related conditions, first trimester: Secondary | ICD-10-CM | POA: Diagnosis not present

## 2017-11-22 DIAGNOSIS — R7989 Other specified abnormal findings of blood chemistry: Secondary | ICD-10-CM | POA: Diagnosis not present

## 2017-11-22 DIAGNOSIS — Z3A12 12 weeks gestation of pregnancy: Secondary | ICD-10-CM | POA: Diagnosis not present

## 2017-11-26 DIAGNOSIS — R7989 Other specified abnormal findings of blood chemistry: Secondary | ICD-10-CM | POA: Diagnosis not present

## 2017-11-26 DIAGNOSIS — O26891 Other specified pregnancy related conditions, first trimester: Secondary | ICD-10-CM | POA: Diagnosis not present

## 2017-11-26 DIAGNOSIS — Z3A12 12 weeks gestation of pregnancy: Secondary | ICD-10-CM | POA: Diagnosis not present

## 2017-11-28 DIAGNOSIS — M9914 Subluxation complex (vertebral) of sacral region: Secondary | ICD-10-CM | POA: Diagnosis not present

## 2017-11-28 DIAGNOSIS — M545 Low back pain: Secondary | ICD-10-CM | POA: Diagnosis not present

## 2017-11-28 DIAGNOSIS — M9905 Segmental and somatic dysfunction of pelvic region: Secondary | ICD-10-CM | POA: Diagnosis not present

## 2017-11-28 DIAGNOSIS — M9903 Segmental and somatic dysfunction of lumbar region: Secondary | ICD-10-CM | POA: Diagnosis not present

## 2017-11-29 DIAGNOSIS — R7989 Other specified abnormal findings of blood chemistry: Secondary | ICD-10-CM | POA: Diagnosis not present

## 2017-11-29 DIAGNOSIS — O09291 Supervision of pregnancy with other poor reproductive or obstetric history, first trimester: Secondary | ICD-10-CM | POA: Diagnosis not present

## 2017-11-29 DIAGNOSIS — O26891 Other specified pregnancy related conditions, first trimester: Secondary | ICD-10-CM | POA: Diagnosis not present

## 2017-11-29 DIAGNOSIS — Z3A13 13 weeks gestation of pregnancy: Secondary | ICD-10-CM | POA: Diagnosis not present

## 2017-12-03 DIAGNOSIS — O09291 Supervision of pregnancy with other poor reproductive or obstetric history, first trimester: Secondary | ICD-10-CM | POA: Diagnosis not present

## 2017-12-03 DIAGNOSIS — O26891 Other specified pregnancy related conditions, first trimester: Secondary | ICD-10-CM | POA: Diagnosis not present

## 2017-12-03 DIAGNOSIS — R7989 Other specified abnormal findings of blood chemistry: Secondary | ICD-10-CM | POA: Diagnosis not present

## 2017-12-03 DIAGNOSIS — M545 Low back pain: Secondary | ICD-10-CM | POA: Diagnosis not present

## 2017-12-03 DIAGNOSIS — M9905 Segmental and somatic dysfunction of pelvic region: Secondary | ICD-10-CM | POA: Diagnosis not present

## 2017-12-03 DIAGNOSIS — M9914 Subluxation complex (vertebral) of sacral region: Secondary | ICD-10-CM | POA: Diagnosis not present

## 2017-12-03 DIAGNOSIS — Z3A13 13 weeks gestation of pregnancy: Secondary | ICD-10-CM | POA: Diagnosis not present

## 2017-12-03 DIAGNOSIS — M9903 Segmental and somatic dysfunction of lumbar region: Secondary | ICD-10-CM | POA: Diagnosis not present

## 2017-12-04 DIAGNOSIS — M9914 Subluxation complex (vertebral) of sacral region: Secondary | ICD-10-CM | POA: Diagnosis not present

## 2017-12-04 DIAGNOSIS — M545 Low back pain: Secondary | ICD-10-CM | POA: Diagnosis not present

## 2017-12-04 DIAGNOSIS — M9905 Segmental and somatic dysfunction of pelvic region: Secondary | ICD-10-CM | POA: Diagnosis not present

## 2017-12-04 DIAGNOSIS — M9903 Segmental and somatic dysfunction of lumbar region: Secondary | ICD-10-CM | POA: Diagnosis not present

## 2017-12-06 DIAGNOSIS — O09292 Supervision of pregnancy with other poor reproductive or obstetric history, second trimester: Secondary | ICD-10-CM | POA: Diagnosis not present

## 2017-12-06 DIAGNOSIS — M9914 Subluxation complex (vertebral) of sacral region: Secondary | ICD-10-CM | POA: Diagnosis not present

## 2017-12-06 DIAGNOSIS — M545 Low back pain: Secondary | ICD-10-CM | POA: Diagnosis not present

## 2017-12-06 DIAGNOSIS — M9905 Segmental and somatic dysfunction of pelvic region: Secondary | ICD-10-CM | POA: Diagnosis not present

## 2017-12-06 DIAGNOSIS — R7989 Other specified abnormal findings of blood chemistry: Secondary | ICD-10-CM | POA: Diagnosis not present

## 2017-12-06 DIAGNOSIS — M9903 Segmental and somatic dysfunction of lumbar region: Secondary | ICD-10-CM | POA: Diagnosis not present

## 2017-12-06 DIAGNOSIS — O26892 Other specified pregnancy related conditions, second trimester: Secondary | ICD-10-CM | POA: Diagnosis not present

## 2017-12-06 DIAGNOSIS — Z3A14 14 weeks gestation of pregnancy: Secondary | ICD-10-CM | POA: Diagnosis not present

## 2017-12-10 DIAGNOSIS — R7989 Other specified abnormal findings of blood chemistry: Secondary | ICD-10-CM | POA: Diagnosis not present

## 2017-12-10 DIAGNOSIS — Z3A13 13 weeks gestation of pregnancy: Secondary | ICD-10-CM | POA: Diagnosis not present

## 2017-12-10 DIAGNOSIS — O09291 Supervision of pregnancy with other poor reproductive or obstetric history, first trimester: Secondary | ICD-10-CM | POA: Diagnosis not present

## 2017-12-10 DIAGNOSIS — O26891 Other specified pregnancy related conditions, first trimester: Secondary | ICD-10-CM | POA: Diagnosis not present

## 2017-12-13 DIAGNOSIS — M9905 Segmental and somatic dysfunction of pelvic region: Secondary | ICD-10-CM | POA: Diagnosis not present

## 2017-12-13 DIAGNOSIS — O26892 Other specified pregnancy related conditions, second trimester: Secondary | ICD-10-CM | POA: Diagnosis not present

## 2017-12-13 DIAGNOSIS — M9914 Subluxation complex (vertebral) of sacral region: Secondary | ICD-10-CM | POA: Diagnosis not present

## 2017-12-13 DIAGNOSIS — M545 Low back pain: Secondary | ICD-10-CM | POA: Diagnosis not present

## 2017-12-13 DIAGNOSIS — Z3A15 15 weeks gestation of pregnancy: Secondary | ICD-10-CM | POA: Diagnosis not present

## 2017-12-13 DIAGNOSIS — R7989 Other specified abnormal findings of blood chemistry: Secondary | ICD-10-CM | POA: Diagnosis not present

## 2017-12-13 DIAGNOSIS — M9903 Segmental and somatic dysfunction of lumbar region: Secondary | ICD-10-CM | POA: Diagnosis not present

## 2017-12-17 DIAGNOSIS — O26891 Other specified pregnancy related conditions, first trimester: Secondary | ICD-10-CM | POA: Diagnosis not present

## 2017-12-17 DIAGNOSIS — R7989 Other specified abnormal findings of blood chemistry: Secondary | ICD-10-CM | POA: Diagnosis not present

## 2017-12-17 DIAGNOSIS — O09291 Supervision of pregnancy with other poor reproductive or obstetric history, first trimester: Secondary | ICD-10-CM | POA: Diagnosis not present

## 2017-12-17 DIAGNOSIS — Z3481 Encounter for supervision of other normal pregnancy, first trimester: Secondary | ICD-10-CM | POA: Diagnosis not present

## 2017-12-17 DIAGNOSIS — M545 Low back pain: Secondary | ICD-10-CM | POA: Diagnosis not present

## 2017-12-17 DIAGNOSIS — Z3A13 13 weeks gestation of pregnancy: Secondary | ICD-10-CM | POA: Diagnosis not present

## 2017-12-17 DIAGNOSIS — M9903 Segmental and somatic dysfunction of lumbar region: Secondary | ICD-10-CM | POA: Diagnosis not present

## 2017-12-17 DIAGNOSIS — M9914 Subluxation complex (vertebral) of sacral region: Secondary | ICD-10-CM | POA: Diagnosis not present

## 2017-12-17 DIAGNOSIS — M9905 Segmental and somatic dysfunction of pelvic region: Secondary | ICD-10-CM | POA: Diagnosis not present

## 2017-12-18 DIAGNOSIS — H9311 Tinnitus, right ear: Secondary | ICD-10-CM | POA: Diagnosis not present

## 2017-12-18 DIAGNOSIS — M545 Low back pain: Secondary | ICD-10-CM | POA: Diagnosis not present

## 2017-12-18 DIAGNOSIS — M9905 Segmental and somatic dysfunction of pelvic region: Secondary | ICD-10-CM | POA: Diagnosis not present

## 2017-12-18 DIAGNOSIS — M9903 Segmental and somatic dysfunction of lumbar region: Secondary | ICD-10-CM | POA: Diagnosis not present

## 2017-12-18 DIAGNOSIS — M9914 Subluxation complex (vertebral) of sacral region: Secondary | ICD-10-CM | POA: Diagnosis not present

## 2017-12-20 DIAGNOSIS — M9903 Segmental and somatic dysfunction of lumbar region: Secondary | ICD-10-CM | POA: Diagnosis not present

## 2017-12-20 DIAGNOSIS — Z3A15 15 weeks gestation of pregnancy: Secondary | ICD-10-CM | POA: Diagnosis not present

## 2017-12-20 DIAGNOSIS — M9905 Segmental and somatic dysfunction of pelvic region: Secondary | ICD-10-CM | POA: Diagnosis not present

## 2017-12-20 DIAGNOSIS — M9914 Subluxation complex (vertebral) of sacral region: Secondary | ICD-10-CM | POA: Diagnosis not present

## 2017-12-20 DIAGNOSIS — R7989 Other specified abnormal findings of blood chemistry: Secondary | ICD-10-CM | POA: Diagnosis not present

## 2017-12-20 DIAGNOSIS — M545 Low back pain: Secondary | ICD-10-CM | POA: Diagnosis not present

## 2017-12-20 DIAGNOSIS — O26892 Other specified pregnancy related conditions, second trimester: Secondary | ICD-10-CM | POA: Diagnosis not present

## 2017-12-24 DIAGNOSIS — M9905 Segmental and somatic dysfunction of pelvic region: Secondary | ICD-10-CM | POA: Diagnosis not present

## 2017-12-24 DIAGNOSIS — M9914 Subluxation complex (vertebral) of sacral region: Secondary | ICD-10-CM | POA: Diagnosis not present

## 2017-12-24 DIAGNOSIS — Z79899 Other long term (current) drug therapy: Secondary | ICD-10-CM | POA: Diagnosis not present

## 2017-12-24 DIAGNOSIS — O26892 Other specified pregnancy related conditions, second trimester: Secondary | ICD-10-CM | POA: Diagnosis not present

## 2017-12-24 DIAGNOSIS — O23592 Infection of other part of genital tract in pregnancy, second trimester: Secondary | ICD-10-CM | POA: Diagnosis not present

## 2017-12-24 DIAGNOSIS — M9903 Segmental and somatic dysfunction of lumbar region: Secondary | ICD-10-CM | POA: Diagnosis not present

## 2017-12-24 DIAGNOSIS — Z3A16 16 weeks gestation of pregnancy: Secondary | ICD-10-CM | POA: Diagnosis not present

## 2017-12-24 DIAGNOSIS — N803 Endometriosis of pelvic peritoneum: Secondary | ICD-10-CM | POA: Diagnosis not present

## 2017-12-24 DIAGNOSIS — R7989 Other specified abnormal findings of blood chemistry: Secondary | ICD-10-CM | POA: Diagnosis not present

## 2017-12-24 DIAGNOSIS — M545 Low back pain: Secondary | ICD-10-CM | POA: Diagnosis not present

## 2017-12-24 DIAGNOSIS — R1084 Generalized abdominal pain: Secondary | ICD-10-CM | POA: Diagnosis not present

## 2017-12-25 DIAGNOSIS — M9905 Segmental and somatic dysfunction of pelvic region: Secondary | ICD-10-CM | POA: Diagnosis not present

## 2017-12-25 DIAGNOSIS — M545 Low back pain: Secondary | ICD-10-CM | POA: Diagnosis not present

## 2017-12-25 DIAGNOSIS — M9903 Segmental and somatic dysfunction of lumbar region: Secondary | ICD-10-CM | POA: Diagnosis not present

## 2017-12-25 DIAGNOSIS — M9914 Subluxation complex (vertebral) of sacral region: Secondary | ICD-10-CM | POA: Diagnosis not present

## 2017-12-27 DIAGNOSIS — R7989 Other specified abnormal findings of blood chemistry: Secondary | ICD-10-CM | POA: Diagnosis not present

## 2017-12-27 DIAGNOSIS — M9905 Segmental and somatic dysfunction of pelvic region: Secondary | ICD-10-CM | POA: Diagnosis not present

## 2017-12-27 DIAGNOSIS — M9914 Subluxation complex (vertebral) of sacral region: Secondary | ICD-10-CM | POA: Diagnosis not present

## 2017-12-27 DIAGNOSIS — O26892 Other specified pregnancy related conditions, second trimester: Secondary | ICD-10-CM | POA: Diagnosis not present

## 2017-12-27 DIAGNOSIS — M545 Low back pain: Secondary | ICD-10-CM | POA: Diagnosis not present

## 2017-12-27 DIAGNOSIS — M9903 Segmental and somatic dysfunction of lumbar region: Secondary | ICD-10-CM | POA: Diagnosis not present

## 2017-12-27 DIAGNOSIS — Z3A16 16 weeks gestation of pregnancy: Secondary | ICD-10-CM | POA: Diagnosis not present

## 2017-12-31 DIAGNOSIS — Z3482 Encounter for supervision of other normal pregnancy, second trimester: Secondary | ICD-10-CM | POA: Diagnosis not present

## 2017-12-31 DIAGNOSIS — M545 Low back pain: Secondary | ICD-10-CM | POA: Diagnosis not present

## 2017-12-31 DIAGNOSIS — Z3A17 17 weeks gestation of pregnancy: Secondary | ICD-10-CM | POA: Diagnosis not present

## 2017-12-31 DIAGNOSIS — M9905 Segmental and somatic dysfunction of pelvic region: Secondary | ICD-10-CM | POA: Diagnosis not present

## 2017-12-31 DIAGNOSIS — M9914 Subluxation complex (vertebral) of sacral region: Secondary | ICD-10-CM | POA: Diagnosis not present

## 2017-12-31 DIAGNOSIS — R7989 Other specified abnormal findings of blood chemistry: Secondary | ICD-10-CM | POA: Diagnosis not present

## 2017-12-31 DIAGNOSIS — M9903 Segmental and somatic dysfunction of lumbar region: Secondary | ICD-10-CM | POA: Diagnosis not present

## 2018-01-01 DIAGNOSIS — M9905 Segmental and somatic dysfunction of pelvic region: Secondary | ICD-10-CM | POA: Diagnosis not present

## 2018-01-01 DIAGNOSIS — M9903 Segmental and somatic dysfunction of lumbar region: Secondary | ICD-10-CM | POA: Diagnosis not present

## 2018-01-01 DIAGNOSIS — M545 Low back pain: Secondary | ICD-10-CM | POA: Diagnosis not present

## 2018-01-01 DIAGNOSIS — M9914 Subluxation complex (vertebral) of sacral region: Secondary | ICD-10-CM | POA: Diagnosis not present

## 2018-01-03 DIAGNOSIS — Z3A18 18 weeks gestation of pregnancy: Secondary | ICD-10-CM | POA: Diagnosis not present

## 2018-01-03 DIAGNOSIS — M545 Low back pain: Secondary | ICD-10-CM | POA: Diagnosis not present

## 2018-01-03 DIAGNOSIS — Z3482 Encounter for supervision of other normal pregnancy, second trimester: Secondary | ICD-10-CM | POA: Diagnosis not present

## 2018-01-03 DIAGNOSIS — R7989 Other specified abnormal findings of blood chemistry: Secondary | ICD-10-CM | POA: Diagnosis not present

## 2018-01-03 DIAGNOSIS — M9905 Segmental and somatic dysfunction of pelvic region: Secondary | ICD-10-CM | POA: Diagnosis not present

## 2018-01-03 DIAGNOSIS — M9903 Segmental and somatic dysfunction of lumbar region: Secondary | ICD-10-CM | POA: Diagnosis not present

## 2018-01-03 DIAGNOSIS — M9914 Subluxation complex (vertebral) of sacral region: Secondary | ICD-10-CM | POA: Diagnosis not present

## 2018-01-07 DIAGNOSIS — M9903 Segmental and somatic dysfunction of lumbar region: Secondary | ICD-10-CM | POA: Diagnosis not present

## 2018-01-07 DIAGNOSIS — Z3482 Encounter for supervision of other normal pregnancy, second trimester: Secondary | ICD-10-CM | POA: Diagnosis not present

## 2018-01-07 DIAGNOSIS — Z3A15 15 weeks gestation of pregnancy: Secondary | ICD-10-CM | POA: Diagnosis not present

## 2018-01-07 DIAGNOSIS — M9905 Segmental and somatic dysfunction of pelvic region: Secondary | ICD-10-CM | POA: Diagnosis not present

## 2018-01-07 DIAGNOSIS — M545 Low back pain: Secondary | ICD-10-CM | POA: Diagnosis not present

## 2018-01-07 DIAGNOSIS — M9914 Subluxation complex (vertebral) of sacral region: Secondary | ICD-10-CM | POA: Diagnosis not present

## 2018-01-07 DIAGNOSIS — R7989 Other specified abnormal findings of blood chemistry: Secondary | ICD-10-CM | POA: Diagnosis not present

## 2018-01-09 DIAGNOSIS — Z3689 Encounter for other specified antenatal screening: Secondary | ICD-10-CM | POA: Diagnosis not present

## 2018-01-10 DIAGNOSIS — M545 Low back pain: Secondary | ICD-10-CM | POA: Diagnosis not present

## 2018-01-10 DIAGNOSIS — M9914 Subluxation complex (vertebral) of sacral region: Secondary | ICD-10-CM | POA: Diagnosis not present

## 2018-01-10 DIAGNOSIS — M9905 Segmental and somatic dysfunction of pelvic region: Secondary | ICD-10-CM | POA: Diagnosis not present

## 2018-01-10 DIAGNOSIS — M9903 Segmental and somatic dysfunction of lumbar region: Secondary | ICD-10-CM | POA: Diagnosis not present

## 2018-01-14 DIAGNOSIS — M9905 Segmental and somatic dysfunction of pelvic region: Secondary | ICD-10-CM | POA: Diagnosis not present

## 2018-01-14 DIAGNOSIS — M9903 Segmental and somatic dysfunction of lumbar region: Secondary | ICD-10-CM | POA: Diagnosis not present

## 2018-01-14 DIAGNOSIS — M545 Low back pain: Secondary | ICD-10-CM | POA: Diagnosis not present

## 2018-01-14 DIAGNOSIS — M9914 Subluxation complex (vertebral) of sacral region: Secondary | ICD-10-CM | POA: Diagnosis not present

## 2018-01-21 DIAGNOSIS — M9903 Segmental and somatic dysfunction of lumbar region: Secondary | ICD-10-CM | POA: Diagnosis not present

## 2018-01-21 DIAGNOSIS — M9914 Subluxation complex (vertebral) of sacral region: Secondary | ICD-10-CM | POA: Diagnosis not present

## 2018-01-21 DIAGNOSIS — M9905 Segmental and somatic dysfunction of pelvic region: Secondary | ICD-10-CM | POA: Diagnosis not present

## 2018-01-21 DIAGNOSIS — M545 Low back pain: Secondary | ICD-10-CM | POA: Diagnosis not present

## 2018-01-22 DIAGNOSIS — M545 Low back pain: Secondary | ICD-10-CM | POA: Diagnosis not present

## 2018-01-22 DIAGNOSIS — M9903 Segmental and somatic dysfunction of lumbar region: Secondary | ICD-10-CM | POA: Diagnosis not present

## 2018-01-22 DIAGNOSIS — M9914 Subluxation complex (vertebral) of sacral region: Secondary | ICD-10-CM | POA: Diagnosis not present

## 2018-01-22 DIAGNOSIS — M9905 Segmental and somatic dysfunction of pelvic region: Secondary | ICD-10-CM | POA: Diagnosis not present

## 2018-01-24 DIAGNOSIS — M9903 Segmental and somatic dysfunction of lumbar region: Secondary | ICD-10-CM | POA: Diagnosis not present

## 2018-01-24 DIAGNOSIS — M9905 Segmental and somatic dysfunction of pelvic region: Secondary | ICD-10-CM | POA: Diagnosis not present

## 2018-01-24 DIAGNOSIS — M545 Low back pain: Secondary | ICD-10-CM | POA: Diagnosis not present

## 2018-01-24 DIAGNOSIS — M9914 Subluxation complex (vertebral) of sacral region: Secondary | ICD-10-CM | POA: Diagnosis not present

## 2018-01-28 DIAGNOSIS — M545 Low back pain: Secondary | ICD-10-CM | POA: Diagnosis not present

## 2018-01-28 DIAGNOSIS — M9905 Segmental and somatic dysfunction of pelvic region: Secondary | ICD-10-CM | POA: Diagnosis not present

## 2018-01-28 DIAGNOSIS — M9903 Segmental and somatic dysfunction of lumbar region: Secondary | ICD-10-CM | POA: Diagnosis not present

## 2018-01-28 DIAGNOSIS — M9914 Subluxation complex (vertebral) of sacral region: Secondary | ICD-10-CM | POA: Diagnosis not present

## 2018-01-31 DIAGNOSIS — M9914 Subluxation complex (vertebral) of sacral region: Secondary | ICD-10-CM | POA: Diagnosis not present

## 2018-01-31 DIAGNOSIS — M545 Low back pain: Secondary | ICD-10-CM | POA: Diagnosis not present

## 2018-01-31 DIAGNOSIS — M9903 Segmental and somatic dysfunction of lumbar region: Secondary | ICD-10-CM | POA: Diagnosis not present

## 2018-01-31 DIAGNOSIS — M9905 Segmental and somatic dysfunction of pelvic region: Secondary | ICD-10-CM | POA: Diagnosis not present

## 2018-02-01 ENCOUNTER — Encounter: Payer: Self-pay | Admitting: Physician Assistant

## 2018-02-01 DIAGNOSIS — M5442 Lumbago with sciatica, left side: Secondary | ICD-10-CM

## 2018-02-06 ENCOUNTER — Encounter: Payer: Self-pay | Admitting: Physical Therapy

## 2018-02-06 ENCOUNTER — Other Ambulatory Visit: Payer: Self-pay

## 2018-02-06 ENCOUNTER — Ambulatory Visit: Payer: BLUE CROSS/BLUE SHIELD | Attending: Physician Assistant | Admitting: Physical Therapy

## 2018-02-06 DIAGNOSIS — M533 Sacrococcygeal disorders, not elsewhere classified: Secondary | ICD-10-CM | POA: Insufficient documentation

## 2018-02-06 DIAGNOSIS — M545 Low back pain, unspecified: Secondary | ICD-10-CM

## 2018-02-06 NOTE — Therapy (Signed)
[facility] [address] [place], [place], [address] Phone: [phone]   Fax:  [phone]  Physical Therapy Evaluation  Patient Details  Name: Melissa Alexander MRN: [id number] Date of Birth: 1990/07/22 Referring Provider (PT): Jomarie Longs, [place]   Encounter Date: 02/06/2018  PT End of Session - 02/06/18 1620    Visit Number  1    Date for PT Re-Evaluation  04/03/18    Authorization Type  BCBS    Authorization Time Period  through 02/19/18    Authorization - Visit Number  1    Authorization - Number of Visits  8    PT Start Time  0330    PT Stop Time  0420    PT Time Calculation (min)  50 min    Equipment Utilized During Treatment  --   SI belt   Activity Tolerance  Patient tolerated treatment well;Patient limited by pain    Behavior During Therapy  [facility] for tasks assessed/performed       Past Medical History:  Diagnosis Date  . Allergy     History reviewed. No pertinent surgical history.  There were no vitals filed for this visit.   Subjective Assessment - 02/06/18 1533    Subjective  Pt is a 27yo female who is [redacted] weeks pregnant with onset of acute Lt LBP and buttock pain approx 1 week ago.  Pt had been seeing a chiropractor 3x/week since she was [redacted] weeks pregnant.  Pt started wearing SI belt this week which has helped a lot.  She wears it full time during the day.  Pt is taking muscle relaxers at night.    Limitations  Standing;Lifting;Other (comment)   going up stairs, bending   How long can you stand comfortably?  4 minutes    Diagnostic tests  none    Patient Stated Goals  strengthen low back and pelvis, get a HEP    Currently in Pain?  Yes    Pain Score  6    was 9 last week before SI belt   Pain Location  Buttocks    Pain Orientation  Left;Upper    Pain Descriptors / Indicators  Sharp    Pain Type  Acute pain    Pain Onset  1 to 4 weeks ago    Pain Frequency  Constant    Aggravating  Factors   standing still, bending, lifting, going up stairs, prolonged sitting in car    Pain Relieving Factors  using SI belt, walking    Effect of Pain on Daily Activities  showering, dressing, caring for toddler         [facility] PT Assessment - 02/06/18 0001      Assessment   Medical Diagnosis  M54.42 (ICD-10-CM) - Acute left-sided low back pain with left-sided sciatica    Referring Provider (PT)  Jomarie Longs, PA-C    Onset Date/Surgical Date  --   approx 1 week ago   Next MD Visit  OB      Precautions   Precautions  None    Precaution Comments  [redacted] weeks pregnant    Required Braces or Orthoses  --   using SI belt     Balance Screen   Has the patient fallen in the past 6 months  No      Home Environment   Living Environment  Private residence    Living Arrangements  Spouse/significant other;Children    Type of Home  House  Home Access  Stairs to enter    Entrance Stairs-Number of Steps  1    Home Layout  Two level    Alternate Level Stairs-Number of Steps  12    Alternate Level Stairs-Rails  Left      Prior Function   Level of Independence  Independent    Astronomer Requirements  internship, sitting, standing, teaching    Leisure  organizing the house      Cognition   Overall Cognitive Status  Within Functional Limits for tasks assessed      Observation/Other Assessments   Observations  --    Focus on Therapeutic Outcomes (FOTO)   70%   38% goal     Functional Tests   Functional tests  Single leg stance      Single Leg Stance   Comments  Fair SLS stability on Lt LE, SI joint gapping, + hip trendelenburg Lt      Posture/Postural Control   Posture/Postural Control  Postural limitations    Posture Comments  genu recurvatum bil, mild genu valgum Lt, audible clicking in pelvis      ROM / Strength   AROM / PROM / Strength  AROM;PROM;Strength      AROM   Overall AROM Comments  lumbar flexion limited 75% due to pain in Lt SI region, all  other planes WNL with end range pain on Lt      Strength   Overall Strength  Within functional limits for tasks performed    Overall Strength Comments  grossly 4+/5 Rt LE, grossly 3+/5 Lt LE secondary to pain with resistance      Special Tests    Special Tests  Sacrolliac Tests    Sacroiliac Tests   --   Active SLR Test     Pelvic Dictraction   Comment  Active SLR Test: easier on Lt with anterior pelvic compression                Objective measurements completed on examination: See above findings.              PT Education - 02/06/18 1617    Education Details  Access Code: [email or web address]     Person(s) Educated  Patient    Methods  Explanation;Demonstration;Verbal cues;Handout    Comprehension  Verbalized understanding       PT Short Term Goals - 02/06/18 1859      PT SHORT TERM GOAL #1   Title  Pt will be independent in intial HEP.    Time  4    Period  Weeks    Status  New    Target Date  03/06/18      PT SHORT TERM GOAL #2   Title  Pt will report at least 25% improvement in low back pain during daily tasks.    Time  4    Period  Weeks    Status  New    Target Date  03/06/18      PT SHORT TERM GOAL #4   Title  Pt will be able to stand for at least 10 min with pain rating </= 4/10.    Time  4    Period  Weeks    Status  New    Target Date  03/06/18        PT Long Term Goals - 02/06/18 1902      PT LONG TERM GOAL #1   Title  Pt will be  ind in advanced HEP.    Time  8    Period  Weeks    Status  New    Target Date  04/03/18      PT LONG TERM GOAL #2   Title  Pt will reduce FOTO score to < or = 40% to demonstrate less limitation.    Time  8    Period  Weeks    Status  New    Target Date  04/03/18      PT LONG TERM GOAL #3   Title  Pt will score at least 4+/5 throughout Lt LE muscle groups to improve ability to perform functional tasks throughout the day.    Time  8    Period  Weeks    Status  New    Target Date  04/03/18      PT  LONG TERM GOAL #4   Title  Pt will be able to stand at least 15 minutes with pain rating </= 3/10.      PT LONG TERM GOAL #5   Title  Pt will be able to utilize core and hip/pelvic stabilizers to demonstrate proper functional squat, Lt LE single leg stance and stair climbing to improve functional use of Lt LE.    Time  8    Period  Weeks    Status  New    Target Date  04/03/18             Plan - 02/06/18 1722    Clinical Impression Statement  Pt is [redacted] weeks pregnant with her second child and presents to PT with acute onset of Lt SI joint pain approx 1 week ago following a chiropractic appointment.  She has had significant relief since starting to wear an SI belt this week which she wears full time during the day.  She has audible clicking in pelvis with postition transitions and acute tenderness across Lt SI joint region.  She has limited trunk flexion, fair Lt LE SLS control at SI joint, and 3+/5 strength in Lt LE due to pain with resistance testing.  She reports some urinary incontinence which is not new and suggests pelvic floor dysfunction.  She will benefit from continued use of the SI belt during daytime along with PT to address pain, ROM, strength, and lumbopelvic stability as she progresses through her pregnancy.     Clinical Presentation  Stable    Clinical Decision Making  Low    Rehab Potential  Excellent    PT Frequency  2x / week    PT Duration  8 weeks    PT Treatment/Interventions  ADLs/Self Care Home Management;Cryotherapy;Iontophoresis 4mg /ml Dexamethasone;Moist Heat;Traction;Electrical Stimulation;Gait training;Stair training;Functional mobility training;Therapeutic activities;Therapeutic exercise;Balance training;Neuromuscular re-education;Patient/family education;Manual techniques;Dry needling;Taping;Joint Manipulations;Spinal Manipulations    PT Next Visit Plan  f/u on HEP, continue lumbopelvic stabilization training    PT Home Exercise Plan  Access Code: [email or web address]      Recommended Other Services  Pt [redacted] weeks pregnant    Consulted and Agree with Plan of Care  Patient       Patient will benefit from skilled therapeutic intervention in order to improve the following deficits and impairments:  Abnormal gait, Improper body mechanics, Pain, Decreased mobility, Hypermobility, Postural dysfunction, Decreased activity tolerance, Decreased endurance, Decreased range of motion, Decreased strength, Decreased balance  Visit Diagnosis: Sacrococcygeal disorders, not elsewhere classified - Plan: PT plan of care cert/re-cert  Acute left-sided low back pain without sciatica - Plan: PT plan of care cert/re-cert  Problem List Patient Active Problem List   Diagnosis Date Noted  . Tinnitus of right ear 12/24/2016  . Sinus pressure 12/24/2016  . Pulsatile tinnitus of right ear 11/30/2016  . Elevated serum creatinine 11/30/2016  . AKI (acute kidney injury) (HCC) 11/01/2016  . Polyphagia 02/25/2016  . Leukocytosis 02/25/2016  . Rhinitis, allergic 06/29/2015  . Seasonal allergies 06/29/2015  . Other migraine with status migrainosus, intractable 06/04/2014  . Hemorrhoid 04/08/2014    Morton Peters, PT 02/06/18 7:15 PM   [facility] [address] [place], [place], [address] Phone: [phone]   Fax:  [phone]  Name: Melissa Alexander MRN: [id number] Date of Birth: [birth year]

## 2018-02-06 NOTE — Patient Instructions (Signed)
Access Code: [email or web address]  URL: [email or web address]  Date: 02/06/2018  Prepared by: Loistine SimasJohanna Beuhring   Exercises  Clamshell - 10 reps - 1 sets - 1x daily - 7x weekly  Supine Pelvic Floor Contract and Release - 10 reps - 1 sets - 10 hold - 1x daily - 7x weekly

## 2018-02-08 ENCOUNTER — Encounter: Payer: Self-pay | Admitting: Physical Therapy

## 2018-02-08 ENCOUNTER — Ambulatory Visit: Payer: BLUE CROSS/BLUE SHIELD | Admitting: Physical Therapy

## 2018-02-08 DIAGNOSIS — M533 Sacrococcygeal disorders, not elsewhere classified: Secondary | ICD-10-CM | POA: Diagnosis not present

## 2018-02-08 DIAGNOSIS — M545 Low back pain, unspecified: Secondary | ICD-10-CM

## 2018-02-08 NOTE — Therapy (Signed)
[facility] [address] [place], [place], [address] Phone: [phone]   Fax:  [phone]  Physical Therapy Treatment  Patient Details  Name: Melissa Alexander MRN: [id number] Date of Birth: 10-26-1990 Referring Provider (PT): Jomarie LongsBreeback, Jade L, [place]   Encounter Date: 02/08/2018  PT End of Session - 02/08/18 1124    Visit Number  2    Date for PT Re-Evaluation  04/03/18    Authorization Type  BCBS 8 visits until  02/19/18    Authorization Time Period  through 02/19/18    Authorization - Visit Number  2    Authorization - Number of Visits  8    PT Start Time  0930    PT Stop Time  1010    PT Time Calculation (min)  40 min    Activity Tolerance  Patient tolerated treatment well       Past Medical History:  Diagnosis Date  . Allergy     History reviewed. No pertinent surgical history.  There were no vitals filed for this visit.  Subjective Assessment - 02/08/18 0937    Subjective  Feeling better.  No pain today.  I've been doing my pelvic floor exercises.  I was doing it wrong in the past so I've been telling my friends the right way to do it.   I was able to lie down with my 28 year old.  I couldn't do that in the past.      Currently in Pain?  No/denies    Pain Score  0-No pain    Pain Location  Back    Pain Type  Acute pain                       [facility] Adult PT Treatment/Exercise - 02/08/18 0001      Lumbar Exercises: Seated   Other Seated Lumbar Exercises  sitting on green physioball with green band rows and extensions 10x each       Lumbar Exercises: Supine   Clam Limitations  back propped on wedge with red band around thighs double and single leg clams 20x    Other Supine Lumbar Exercises  back propped on wedge heel slides with hover slightly above bed 10x    Other Supine Lumbar Exercises  back propped on wedge with heel taps 10x       Lumbar Exercises: Sidelying   Clam  Right;10  reps    Clam Limitations  red band       Lumbar Exercises: Quadruped   Straight Leg Raise  10 reps               PT Short Term Goals - 02/06/18 1859      PT SHORT TERM GOAL #1   Title  Pt will be independent in intial HEP.    Time  4    Period  Weeks    Status  New    Target Date  03/06/18      PT SHORT TERM GOAL #2   Title  Pt will report at least 25% improvement in low back pain during daily tasks.    Time  4    Period  Weeks    Status  New    Target Date  03/06/18      PT SHORT TERM GOAL #4   Title  Pt will be able to stand for at least 10 min with pain rating </= 4/10.  Time  4    Period  Weeks    Status  New    Target Date  03/06/18        PT Long Term Goals - 02/06/18 1902      PT LONG TERM GOAL #1   Title  Pt will be ind in advanced HEP.    Time  8    Period  Weeks    Status  New    Target Date  04/03/18      PT LONG TERM GOAL #2   Title  Pt will reduce FOTO score to < or = 40% to demonstrate less limitation.    Time  8    Period  Weeks    Status  New    Target Date  04/03/18      PT LONG TERM GOAL #3   Title  Pt will score at least 4+/5 throughout Lt LE muscle groups to improve ability to perform functional tasks throughout the day.    Time  8    Period  Weeks    Status  New    Target Date  04/03/18      PT LONG TERM GOAL #4   Title  Pt will be able to stand at least 15 minutes with pain rating </= 3/10.      PT LONG TERM GOAL #5   Title  Pt will be able to utilize core and hip/pelvic stabilizers to demonstrate proper functional squat, Lt LE single leg stance and stair climbing to improve functional use of Lt LE.    Time  8    Period  Weeks    Status  New    Target Date  04/03/18            Plan - 02/08/18 1125    Clinical Impression Statement  The patient reports good relief with her maternity SI belt as well as from her initial HEP.  She demonstrates good carryover of the home program and is able to tolerate a progression  of exercise without the production of pain.  Weakness apparent in left LE with heel slides, taps and with weight bearing on left in quadruped.  Therapist closely monitoring response with all interventions.      Rehab Potential  Excellent    PT Frequency  2x / week    PT Duration  8 weeks    PT Treatment/Interventions  ADLs/Self Care Home Management;Cryotherapy;Iontophoresis 4mg /ml Dexamethasone;Moist Heat;Traction;Electrical Stimulation;Gait training;Stair training;Functional mobility training;Therapeutic activities;Therapeutic exercise;Balance training;Neuromuscular re-education;Patient/family education;Manual techniques;Dry needling;Taping;Joint Manipulations;Spinal Manipulations    PT Next Visit Plan   HEP, continue lumbopelvic stabilization training    PT Home Exercise Plan  Access Code: [email or web address]        Patient will benefit from skilled therapeutic intervention in order to improve the following deficits and impairments:  Abnormal gait, Improper body mechanics, Pain, Decreased mobility, Hypermobility, Postural dysfunction, Decreased activity tolerance, Decreased endurance, Decreased range of motion, Decreased strength, Decreased balance  Visit Diagnosis: Sacrococcygeal disorders, not elsewhere classified  Acute left-sided low back pain without sciatica     Problem List Patient Active Problem List   Diagnosis Date Noted  . Tinnitus of right ear 12/24/2016  . Sinus pressure 12/24/2016  . Pulsatile tinnitus of right ear 11/30/2016  . Elevated serum creatinine 11/30/2016  . AKI (acute kidney injury) (HCC) 11/01/2016  . Polyphagia 02/25/2016  . Leukocytosis 02/25/2016  . Rhinitis, allergic 06/29/2015  . Seasonal allergies 06/29/2015  . Other migraine with status migrainosus, intractable 06/04/2014  .  Hemorrhoid 04/08/2014   Melissa SharpsStacy Alexander, PT 02/08/18 11:32 AM Phone: [phone] Fax: [phone]  Melissa Alexander, Melissa C 02/08/2018, 11:32 AM  [facility] [address] [place], [place], [address] Phone: [phone]   Fax:  [phone]  Name: Melissa LoronLinda Broz MRN: [id number] Date of Birth: 1990/05/24

## 2018-02-08 NOTE — Patient Instructions (Signed)
Access Code: [email or web address]  URL: [email or web address]  Date: 02/08/2018  Prepared by: Lavinia SharpsStacy Ricahrd Schwager   Exercises  Clamshell - 10 reps - 1 sets - 1x daily - 7x weekly  Supine Pelvic Floor Contract and Release - 10 reps - 1 sets - 10 hold - 1x daily - 7x weekly  Seated Hip Adduction Squeeze with Ball - 10 reps - 1 sets - 1x daily - 7x weekly  Seated Hip Abduction with Resistance - 10 reps - 3 sets - 1x daily - 7x weekly  Modified Supine 90/90 Foot Taps - 10 reps - 10 sets - 1x daily - 7x weekly  Modified Supine Leg Extension - 10 reps - 1 sets - 1x daily - 7x weekly  Quadruped Leg Lifts - 10 reps - 1 sets - 1x daily - 7x weekly

## 2018-02-14 DIAGNOSIS — O26892 Other specified pregnancy related conditions, second trimester: Secondary | ICD-10-CM | POA: Diagnosis not present

## 2018-02-14 DIAGNOSIS — Z369 Encounter for antenatal screening, unspecified: Secondary | ICD-10-CM | POA: Diagnosis not present

## 2018-02-14 DIAGNOSIS — Z113 Encounter for screening for infections with a predominantly sexual mode of transmission: Secondary | ICD-10-CM | POA: Diagnosis not present

## 2018-02-14 DIAGNOSIS — Z3689 Encounter for other specified antenatal screening: Secondary | ICD-10-CM | POA: Diagnosis not present

## 2018-02-14 DIAGNOSIS — Z3A24 24 weeks gestation of pregnancy: Secondary | ICD-10-CM | POA: Diagnosis not present

## 2018-02-14 DIAGNOSIS — R7989 Other specified abnormal findings of blood chemistry: Secondary | ICD-10-CM | POA: Diagnosis not present

## 2018-02-14 DIAGNOSIS — O99342 Other mental disorders complicating pregnancy, second trimester: Secondary | ICD-10-CM | POA: Diagnosis not present

## 2018-02-15 ENCOUNTER — Encounter

## 2018-02-15 ENCOUNTER — Encounter: Payer: Self-pay | Admitting: Physical Therapy

## 2018-02-15 ENCOUNTER — Ambulatory Visit: Payer: BLUE CROSS/BLUE SHIELD | Admitting: Physical Therapy

## 2018-02-15 DIAGNOSIS — M533 Sacrococcygeal disorders, not elsewhere classified: Secondary | ICD-10-CM

## 2018-02-15 DIAGNOSIS — M545 Low back pain, unspecified: Secondary | ICD-10-CM

## 2018-02-15 NOTE — Therapy (Signed)
[facility] [address], [address] [place], [place], [address] Phone: [phone]   Fax:  [phone]  Physical Therapy Treatment  Patient Details  Name: Melissa Alexander MRN: [id number] Date of Birth: Mar 04, 1990 Referring Provider (PT): Jomarie LongsBreeback, Jade L, [place]   Encounter Date: 02/15/2018  PT End of Session - 02/15/18 0844    Visit Number  3    Date for PT Re-Evaluation  04/03/18    Authorization Type  [employer] 8 visits until  02/19/18    Authorization Time Period  through 02/19/18    Authorization - Visit Number  3    Authorization - Number of Visits  8    PT Start Time  0845    PT Stop Time  0927    PT Time Calculation (min)  42 min    Activity Tolerance  Patient tolerated treatment well    Behavior During Therapy  [facility] for tasks assessed/performed       Past Medical History:  Diagnosis Date  . Allergy     History reviewed. No pertinent surgical history.  There were no vitals filed for this visit.  Subjective Assessment - 02/15/18 0845    Subjective  Pt has stopped wearing SI belt due to improved pain.  Hasn't needed it for about a week.  Only gets stabs of pain when bending to pick something up off floor which is rated 8/10 but doesn't last.    Limitations  Standing;Lifting;Other (comment)    How long can you stand comfortably?  10-15 min    Currently in Pain?  Yes    Pain Score  8    only spikes of pain when bending   Pain Location  Back    Pain Orientation  Left;Lower    Pain Descriptors / Indicators  Sharp    Pain Type  Acute pain    Pain Onset  1 to 4 weeks ago    Pain Frequency  Intermittent    Aggravating Factors   bending                       [facility] Adult PT Treatment/Exercise - 02/15/18 0001      Therapeutic Activites    Therapeutic Activities  --   bending strategies, squat and SLS with posterior leg lift     Exercises   Exercises  Lumbar;Knee/Hip      Lumbar Exercises: Standing    Other Standing Lumbar Exercises  1# alt lateral and fwd raises standing on black pad x 10 reps each    Other Standing Lumbar Exercises  sidestepping green band around knees to fatigue   PT cued maintained mini squat and small steps, core     Lumbar Exercises: Seated   Other Seated Lumbar Exercises  on green ball: pelvic tilts, pelvic circles bil, LAQ, marcing, all x 10 reps      Lumbar Exercises: Supine   Clam Limitations  back propped on wedge with red band around thighs double and single leg clams 20x    Other Supine Lumbar Exercises  back propped on wedge heel slides with hover slightly above bed 10x    Other Supine Lumbar Exercises  back propped on wedge with heel taps 10x       Knee/Hip Exercises: Seated   Clamshell with TheraBand  Green   10 reps, PT cued pelvic floor              PT Short Term Goals - 02/15/18  [id number]      PT SHORT TERM GOAL #1   Title  Pt will be independent in intial HEP.    Time  4    Period  Weeks    Status  On-going      PT SHORT TERM GOAL #2   Title  Pt will report at least 25% improvement in low back pain during daily tasks.    Time  4    Period  Weeks    Status  Achieved      PT SHORT TERM GOAL #4   Title  Pt will be able to stand for at least 10 min with pain rating </= 4/10.    Time  4    Period  Weeks    Status  Achieved        PT Long Term Goals - 02/15/18 [id number]      PT LONG TERM GOAL #5   Title  Pt will be able to utilize core and hip/pelvic stabilizers to demonstrate proper functional squat, Lt LE single leg stance and stair climbing to improve functional use of Lt LE.    Time  8    Period  Weeks    Status  On-going            Plan - 02/15/18 [id number]    Clinical Impression Statement  Pt doing very well with good pain control.  Demonstrates improving awareness of bending, lifting strategies with PT cueing and demo today.  Able to perform bending without pain with these strategies.  Pt bought a physioball and tband to  continue HEP at home.  Pt will continue to benefit from skilled PT for lumbo-pelvic stabilization in all positions and with functional tasks.    Rehab Potential  Excellent    PT Frequency  2x / week    PT Duration  8 weeks    PT Treatment/Interventions  ADLs/Self Care Home Management;Cryotherapy;Iontophoresis 4mg /ml Dexamethasone;Moist Heat;Traction;Electrical Stimulation;Gait training;Stair training;Functional mobility training;Therapeutic activities;Therapeutic exercise;Balance training;Neuromuscular re-education;Patient/family education;Manual techniques;Dry needling;Taping;Joint Manipulations;Spinal Manipulations    PT Next Visit Plan   HEP, continue lumbopelvic stabilization training, add to HEP    PT Home Exercise Plan  Access Code: [email or web address]     Recommended Other Services  Pt [redacted] weeks pregnant    Consulted and Agree with Plan of Care  Patient       Patient will benefit from skilled therapeutic intervention in order to improve the following deficits and impairments:  Abnormal gait, Improper body mechanics, Pain, Decreased mobility, Hypermobility, Postural dysfunction, Decreased activity tolerance, Decreased endurance, Decreased range of motion, Decreased strength, Decreased balance  Visit Diagnosis: Sacrococcygeal disorders, not elsewhere classified  Acute left-sided low back pain without sciatica     Problem List Patient Active Problem List   Diagnosis Date Noted  . Tinnitus of right ear 12/24/2016  . Sinus pressure 12/24/2016  . Pulsatile tinnitus of right ear 11/30/2016  . Elevated serum creatinine 11/30/2016  . AKI (acute kidney injury) (HCC) 11/01/2016  . Polyphagia 02/25/2016  . Leukocytosis 02/25/2016  . Rhinitis, allergic 06/29/2015  . Seasonal allergies 06/29/2015  . Other migraine with status migrainosus, intractable 06/04/2014  . Hemorrhoid 04/08/2014    Melissa PetersJohanna Eilleen Alexander, PT 02/15/18 9:30 AM   [facility] [address], [address] [place], [place], [address] Phone: [phone]   Fax:  [phone]  Name: Melissa LoronLinda Tangeman MRN: [id number] Date of Birth: 07/09/1990

## 2018-02-22 ENCOUNTER — Encounter

## 2018-02-25 ENCOUNTER — Ambulatory Visit: Payer: BLUE CROSS/BLUE SHIELD | Admitting: Physical Therapy

## 2018-02-27 ENCOUNTER — Ambulatory Visit: Payer: BLUE CROSS/BLUE SHIELD | Attending: Physician Assistant | Admitting: Physical Therapy

## 2018-02-27 ENCOUNTER — Encounter: Payer: Self-pay | Admitting: Physical Therapy

## 2018-02-27 DIAGNOSIS — M545 Low back pain, unspecified: Secondary | ICD-10-CM

## 2018-02-27 DIAGNOSIS — M533 Sacrococcygeal disorders, not elsewhere classified: Secondary | ICD-10-CM | POA: Insufficient documentation

## 2018-02-27 NOTE — Therapy (Signed)
[facility] [address] [place], [place], [address] Phone: [phone]   Fax:  [phone]  Physical Therapy Treatment  Patient Details  Name: Melissa Alexander MRN: [id number] Date of Birth: 03/15/1990 Referring Provider (PT): Donella Stade, [place]   Encounter Date: 02/27/2018  PT End of Session - 02/27/18 1142    Visit Number  4    Date for PT Re-Evaluation  04/03/18    Authorization Type  BCBS    Authorization Time Period  30 visits/year    Authorization - Visit Number  1    Authorization - Number of Visits  30    PT Start Time  1101    PT Stop Time  1140    PT Time Calculation (min)  39 min    Activity Tolerance  Patient tolerated treatment well    Behavior During Therapy  [facility] for tasks assessed/performed       Past Medical History:  Diagnosis Date  . Allergy     History reviewed. No pertinent surgical history.  There were no vitals filed for this visit.                    [facility] Adult PT Treatment/Exercise - 02/27/18 0001      Exercises   Exercises  Lumbar;Knee/Hip      Lumbar Exercises: Stretches   Figure 4 Stretch  30 seconds;1 rep;Seated;With overpressure    Other Lumbar Stretch Exercise  physioball flexion and flexion rotation bil 5x5 sec each      Lumbar Exercises: Seated   Long Arc Quad on Sammy [place]  Both;10 reps    Hip Flexion on [place]  Both;10 reps    Other Seated Lumbar Exercises  on green ball: pelvic tilts, lateral pelvic tilts, pelvic circles, red tband row and shoulder extension, 10 reps each      Lumbar Exercises: Sidelying   Clam  Both;10 reps      Lumbar Exercises: Quadruped   Straight Leg Raise  5 reps   PT cued "hold grape in belly button" for core     Knee/Hip Exercises: Standing   Wall Squat  10 reps    Wall Squat Limitations  with ball on wall and hip abd resistance band    Other Standing Knee Exercises  sidestepping and monster walks green tband to fatigue              PT Education - 02/27/18 1141    Education Details  Access Code: [email or web address]    Person(s) Educated  Patient    Methods  Explanation;Demonstration;Verbal cues;Handout    Comprehension  Verbalized understanding;Returned demonstration       PT Short Term Goals - 02/27/18 1145      PT SHORT TERM GOAL #1   Title  Pt will be independent in intial HEP.    Time  4    Period  Weeks    Status  Achieved      PT SHORT TERM GOAL #2   Title  Pt will report at least 25% improvement in low back pain during daily tasks.    Status  Achieved      PT SHORT TERM GOAL #4   Title  Pt will be able to stand for at least 10 min with pain rating </= 4/10.    Status  Achieved        PT Long Term Goals - 02/27/18 1146      PT LONG TERM GOAL #1  Title  Pt will be ind in advanced HEP.    Status  Achieved      PT LONG TERM GOAL #3   Title  Pt will score at least 4+/5 throughout Lt LE muscle groups to improve ability to perform functional tasks throughout the day.    Status  Achieved      PT LONG TERM GOAL #4   Title  Pt will be able to stand at least 15 minutes with pain rating </= 3/10.    Status  Achieved      PT LONG TERM GOAL #5   Title  Pt will be able to utilize core and hip/pelvic stabilizers to demonstrate proper functional squat, Lt LE single leg stance and stair climbing to improve functional use of Lt LE.    Status  Achieved            Plan - 02/27/18 1143    Clinical Impression Statement  Pt doing very well.  No pain and exercises are going well.  She expressed interest in saving further appointments for later in pregnancy if pain returns or post-delivery.  She has good understanding and recruitment of core and body mechanics for functional tasks without pain at this time.  PT agreed to d/c to HEP with f/u as needed.    History and Personal Factors relevant to plan of care:  Pt pregnant - due mid-April    Clinical Presentation  Stable    Clinical Decision Making   Low    Rehab Potential  Excellent    PT Frequency  2x / week    PT Duration  8 weeks    PT Treatment/Interventions  ADLs/Self Care Home Management;Cryotherapy;Iontophoresis 70m/ml Dexamethasone;Moist Heat;Traction;Electrical Stimulation;Gait training;Stair training;Functional mobility training;Therapeutic activities;Therapeutic exercise;Balance training;Neuromuscular re-education;Patient/family education;Manual techniques;Dry needling;Taping;Joint Manipulations;Spinal Manipulations    PT Next Visit Plan  d/c to HEP with f/u as needed or post-delivery    PT Home Exercise Plan  Access Code: [email or web address]    Consulted and Agree with Plan of Care  Patient       Patient will benefit from skilled therapeutic intervention in order to improve the following deficits and impairments:  Abnormal gait, Improper body mechanics, Pain, Decreased mobility, Hypermobility, Postural dysfunction, Decreased activity tolerance, Decreased endurance, Decreased range of motion, Decreased strength, Decreased balance  Visit Diagnosis: Sacrococcygeal disorders, not elsewhere classified  Acute left-sided low back pain without sciatica     Problem List Patient Active Problem List   Diagnosis Date Noted  . Tinnitus of right ear 12/24/2016  . Sinus pressure 12/24/2016  . Pulsatile tinnitus of right ear 11/30/2016  . Elevated serum creatinine 11/30/2016  . [place] 11/01/2016  . Polyphagia 02/25/2016  . Leukocytosis 02/25/2016  . Rhinitis, allergic 06/29/2015  . Seasonal allergies 06/29/2015  . Other migraine with status migrainosus, intractable 06/04/2014  . Hemorrhoid 04/08/2014    PHYSICAL THERAPY DISCHARGE SUMMARY  Visits from Start of Care: 4  Current functional level related to goals / functional outcomes: See above   Remaining deficits: See above   Education / Equipment: HEP, tbands Plan: Patient agrees to discharge.  Patient goals were partially met. Patient is being  discharged due to meeting the stated rehab goals.  ?????     JBaruch Merl PT 02/27/18 11:52 AM   [facility] [address] [place] [address]Phone: [phone]  Fax:  [phone] Name: LTamia DialMRN: [id number] of Birth: 1July 10, 1992

## 2018-02-27 NOTE — Patient Instructions (Signed)
Access Code: [email or web address]  URL: [email or web address]  Date: 02/27/2018  Prepared by: Loistine Simas Mckinley Olheiser   Exercises  Clamshell - 10 reps - 1 sets - 1x daily - 7x weekly  Supine Pelvic Floor Contract and Release - 10 reps - 1 sets - 10 hold - 1x daily - 7x weekly  Seated Hip Adduction Squeeze with Ball - 10 reps - 1 sets - 1x daily - 7x weekly  Seated Hip Abduction with Resistance - 10 reps - 3 sets - 1x daily - 7x weekly  Quadruped Leg Lifts - 10 reps - 1 sets - 1x daily - 7x weekly  Seated Swiss Ball Pelvic Circles - 10 reps - 3 sets - 1x daily - 7x weekly  Seated Lateral Pelvic Tilt on Swiss Ball - 10 reps - 3 sets - 1x daily - 7x weekly  Swiss Ball March - 10 reps - 3 sets - 1x daily - 7x weekly  Swiss Ball Knee Extension - 10 reps - 3 sets - 1x daily - 7x weekly  Seated Thoracic Flexion and Rotation with Swiss Ball - 10 reps - 1 sets - 5 hold - 1x daily - 7x weekly  Seated Flexion Stretch with Swiss Ball - 10 reps - 1 sets - 5 hold - 1x daily - 7x weekly  Wall Squat with Swiss Ball and Resistance Loop - 10 reps - 3 sets - 1x daily - 7x weekly  Row with Anchored Resistance on [employer] - 10 reps - 3 sets - 1x daily - 7x weekly  Side Stepping with Resistance at Thighs - 10 reps - 3 sets - 1x daily - 7x weekly  Seated Shoulder Extension and Scapular Retraction with Resistance on Swiss Ball - 10 reps - 3 sets - 1x daily - 7x weekly  Forward Monster Walks - 10 reps - 3 sets - 1x daily - 7x weekly  Backward Monster Walks - 10 reps - 3 sets - 1x daily - 7x weekly

## 2018-03-04 ENCOUNTER — Encounter: Payer: BLUE CROSS/BLUE SHIELD | Admitting: Physical Therapy

## 2018-03-06 DIAGNOSIS — N898 Other specified noninflammatory disorders of vagina: Secondary | ICD-10-CM | POA: Diagnosis not present

## 2018-03-06 DIAGNOSIS — O479 False labor, unspecified: Secondary | ICD-10-CM | POA: Diagnosis not present

## 2018-03-08 ENCOUNTER — Encounter: Payer: BLUE CROSS/BLUE SHIELD | Admitting: Physical Therapy

## 2018-03-11 ENCOUNTER — Encounter: Payer: BLUE CROSS/BLUE SHIELD | Admitting: Physical Therapy

## 2018-03-11 DIAGNOSIS — Z3482 Encounter for supervision of other normal pregnancy, second trimester: Secondary | ICD-10-CM | POA: Diagnosis not present

## 2018-03-11 DIAGNOSIS — R7989 Other specified abnormal findings of blood chemistry: Secondary | ICD-10-CM | POA: Diagnosis not present

## 2018-03-11 DIAGNOSIS — Z3A27 27 weeks gestation of pregnancy: Secondary | ICD-10-CM | POA: Diagnosis not present

## 2018-03-11 DIAGNOSIS — Z23 Encounter for immunization: Secondary | ICD-10-CM | POA: Diagnosis not present

## 2018-03-15 ENCOUNTER — Encounter: Payer: BLUE CROSS/BLUE SHIELD | Admitting: Physical Therapy

## 2018-03-18 ENCOUNTER — Encounter: Payer: BLUE CROSS/BLUE SHIELD | Admitting: Physical Therapy

## 2018-03-18 DIAGNOSIS — Z3A28 28 weeks gestation of pregnancy: Secondary | ICD-10-CM | POA: Diagnosis not present

## 2018-03-18 DIAGNOSIS — O99283 Endocrine, nutritional and metabolic diseases complicating pregnancy, third trimester: Secondary | ICD-10-CM | POA: Diagnosis not present

## 2018-03-18 DIAGNOSIS — O26893 Other specified pregnancy related conditions, third trimester: Secondary | ICD-10-CM | POA: Diagnosis not present

## 2018-03-18 DIAGNOSIS — E282 Polycystic ovarian syndrome: Secondary | ICD-10-CM | POA: Diagnosis not present

## 2018-03-18 DIAGNOSIS — Z7984 Long term (current) use of oral hypoglycemic drugs: Secondary | ICD-10-CM | POA: Diagnosis not present

## 2018-03-18 DIAGNOSIS — Z79899 Other long term (current) drug therapy: Secondary | ICD-10-CM | POA: Diagnosis not present

## 2018-03-18 DIAGNOSIS — R1084 Generalized abdominal pain: Secondary | ICD-10-CM | POA: Diagnosis not present

## 2018-03-18 DIAGNOSIS — R109 Unspecified abdominal pain: Secondary | ICD-10-CM | POA: Diagnosis not present

## 2018-03-22 ENCOUNTER — Encounter: Payer: BLUE CROSS/BLUE SHIELD | Admitting: Physical Therapy

## 2018-03-29 DIAGNOSIS — O26893 Other specified pregnancy related conditions, third trimester: Secondary | ICD-10-CM | POA: Diagnosis not present

## 2018-03-29 DIAGNOSIS — R05 Cough: Secondary | ICD-10-CM | POA: Diagnosis not present

## 2018-03-29 DIAGNOSIS — J069 Acute upper respiratory infection, unspecified: Secondary | ICD-10-CM | POA: Diagnosis not present

## 2018-03-29 DIAGNOSIS — Z3A30 30 weeks gestation of pregnancy: Secondary | ICD-10-CM | POA: Diagnosis not present

## 2018-03-31 DIAGNOSIS — M791 Myalgia, unspecified site: Secondary | ICD-10-CM | POA: Diagnosis not present

## 2018-03-31 DIAGNOSIS — Z1159 Encounter for screening for other viral diseases: Secondary | ICD-10-CM | POA: Diagnosis not present

## 2018-03-31 DIAGNOSIS — J029 Acute pharyngitis, unspecified: Secondary | ICD-10-CM | POA: Diagnosis not present

## 2018-03-31 DIAGNOSIS — R51 Headache: Secondary | ICD-10-CM | POA: Diagnosis not present

## 2018-03-31 DIAGNOSIS — J101 Influenza due to other identified influenza virus with other respiratory manifestations: Secondary | ICD-10-CM | POA: Diagnosis not present

## 2018-04-05 DIAGNOSIS — Z3A31 31 weeks gestation of pregnancy: Secondary | ICD-10-CM | POA: Diagnosis not present

## 2018-04-05 DIAGNOSIS — R7989 Other specified abnormal findings of blood chemistry: Secondary | ICD-10-CM | POA: Diagnosis not present

## 2018-04-05 DIAGNOSIS — Z3480 Encounter for supervision of other normal pregnancy, unspecified trimester: Secondary | ICD-10-CM | POA: Diagnosis not present

## 2018-04-19 DIAGNOSIS — Z3A33 33 weeks gestation of pregnancy: Secondary | ICD-10-CM | POA: Diagnosis not present

## 2018-04-19 DIAGNOSIS — R7989 Other specified abnormal findings of blood chemistry: Secondary | ICD-10-CM | POA: Diagnosis not present

## 2018-05-09 DIAGNOSIS — Z3685 Encounter for antenatal screening for Streptococcus B: Secondary | ICD-10-CM | POA: Diagnosis not present

## 2018-05-09 DIAGNOSIS — Z3A36 36 weeks gestation of pregnancy: Secondary | ICD-10-CM | POA: Diagnosis not present

## 2018-05-15 IMAGING — US US RENAL
1 series · 14 of 25 positions shown · non-contrast
Comparison: Pelvis ultrasound 02/28/2016.

CLINICAL DATA: 25-year-old female with urinary frequency for 1-2
months and decreased renal function.

EXAM:
RENAL / URINARY TRACT ULTRASOUND COMPLETE

[Series 1: us renal · 0.17mm/px · 14 of 26 slices shown]
[im 1/26]
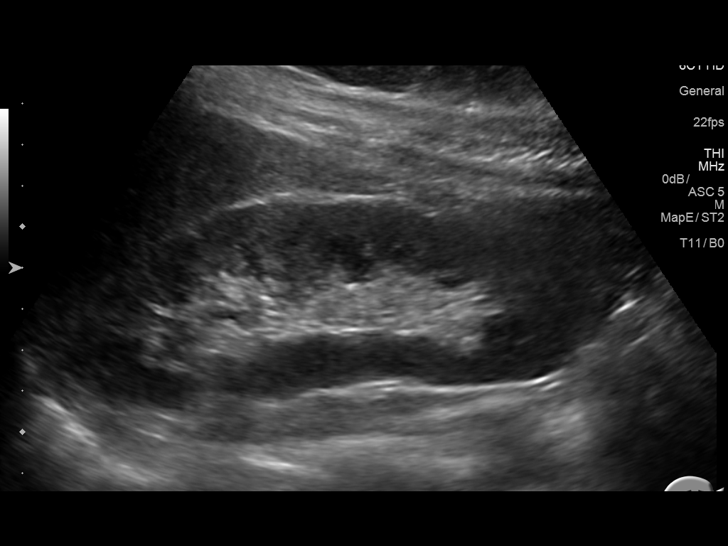
[im 3/26]
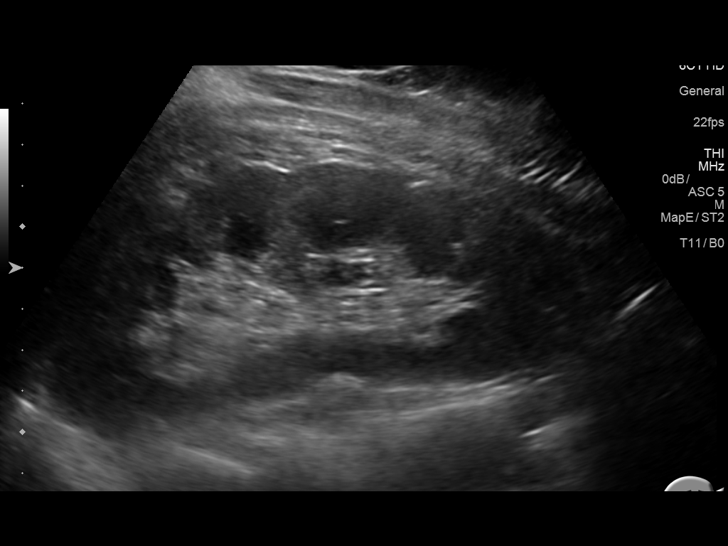
[im 5/26]
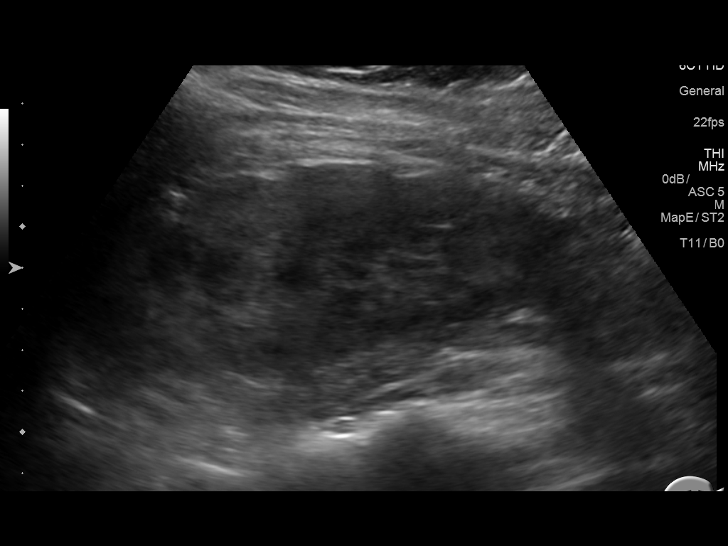
[im 7/26]
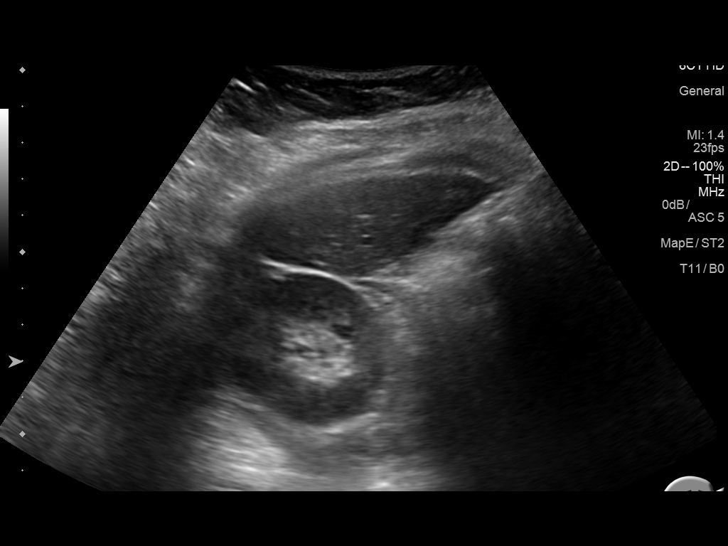
[im 9/26]
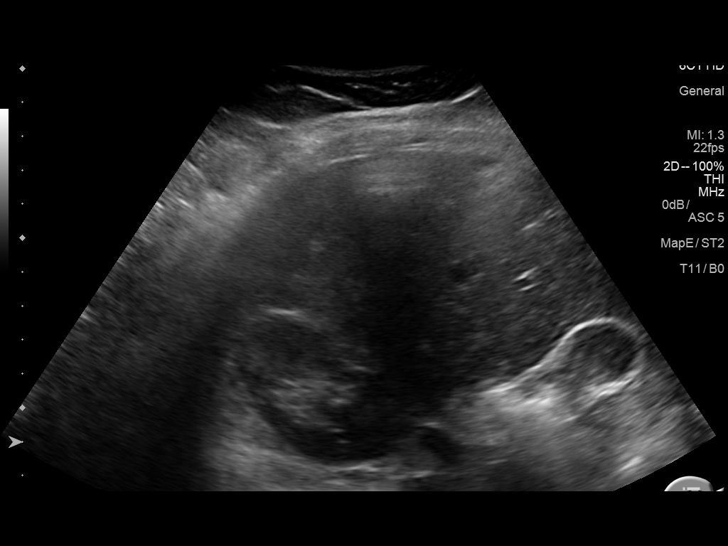
[im 10/26]
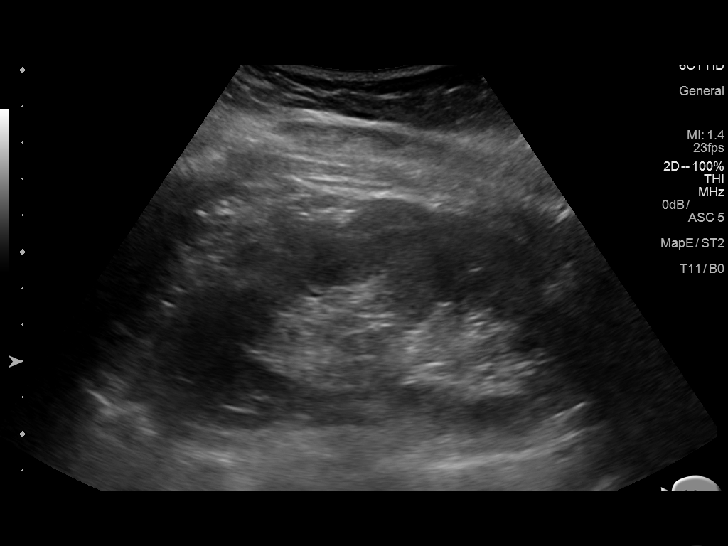
[im 12/26]
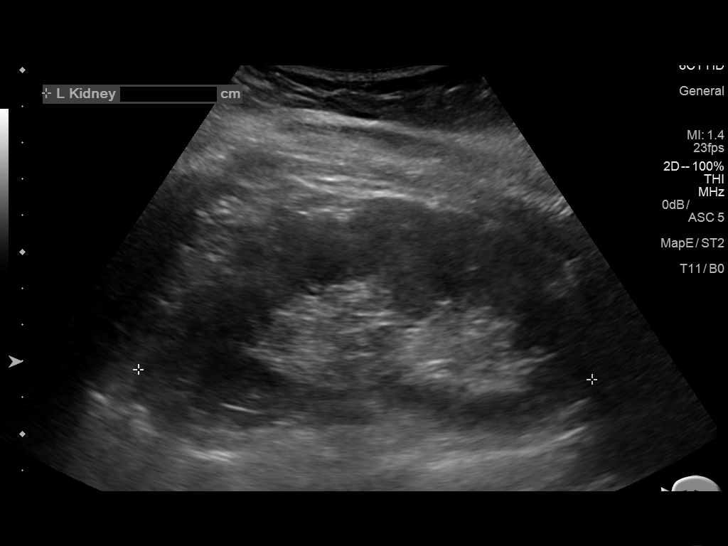
[im 14/26]
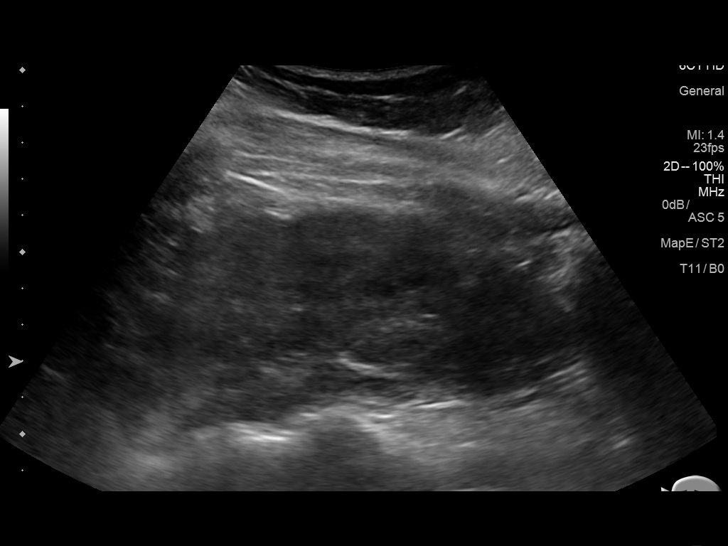
[im 16/26]
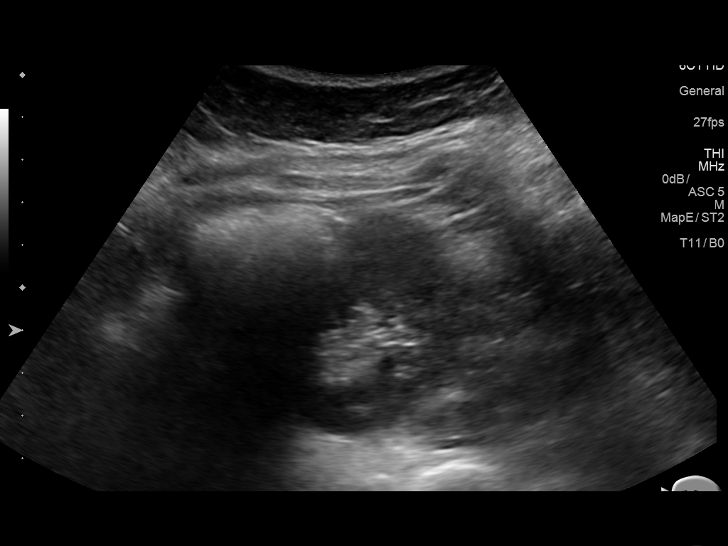
[im 17/26]
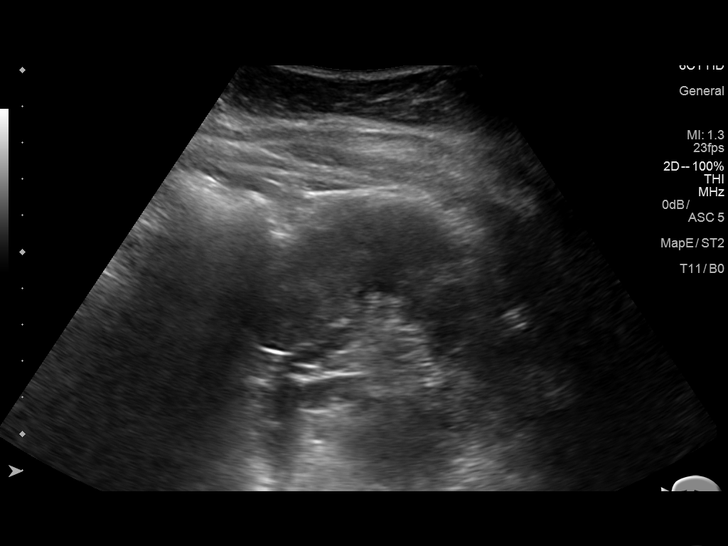
[im 19/26]
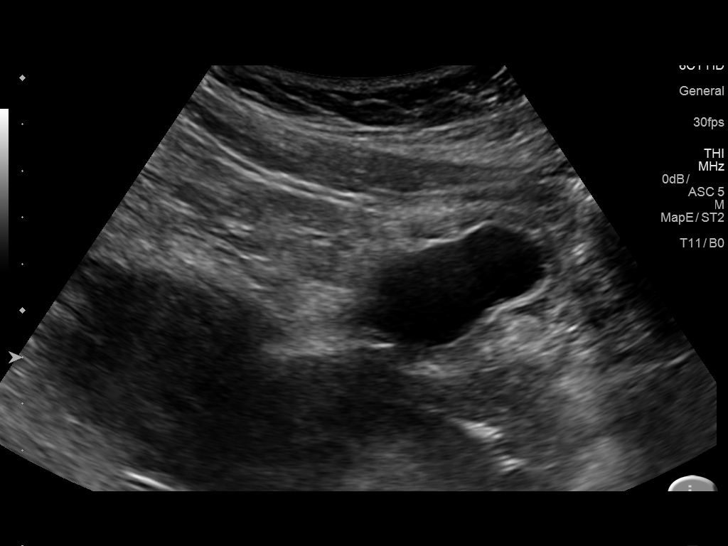
[im 21/26]
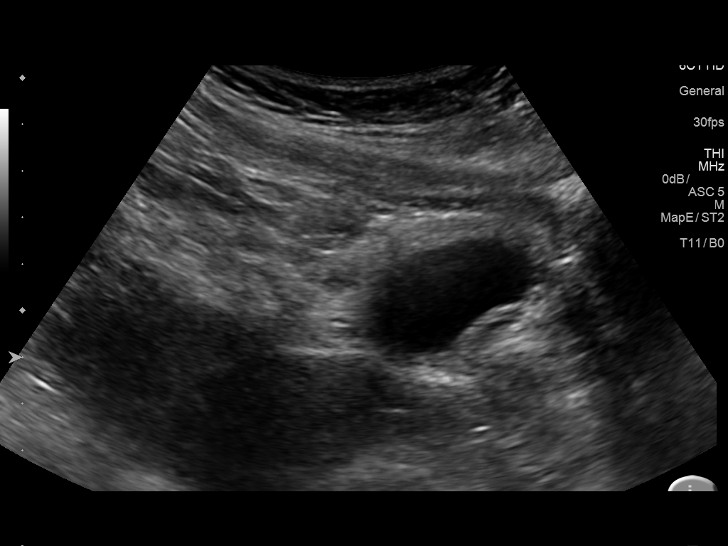
[im 23/26]
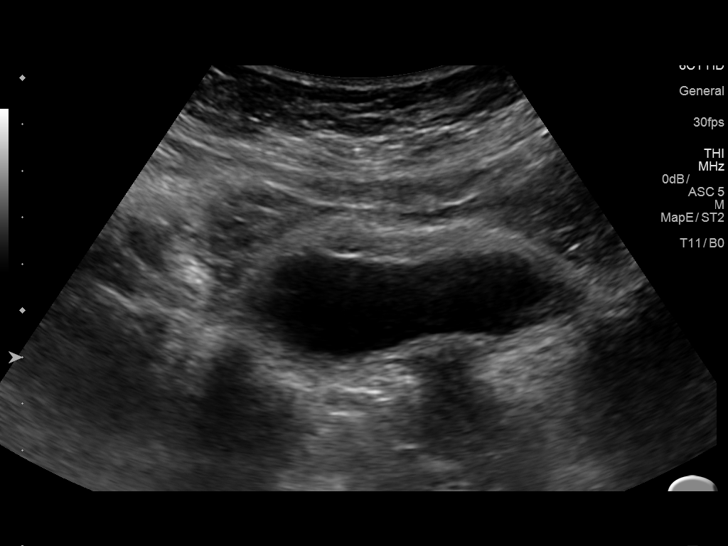
[im 26/26]
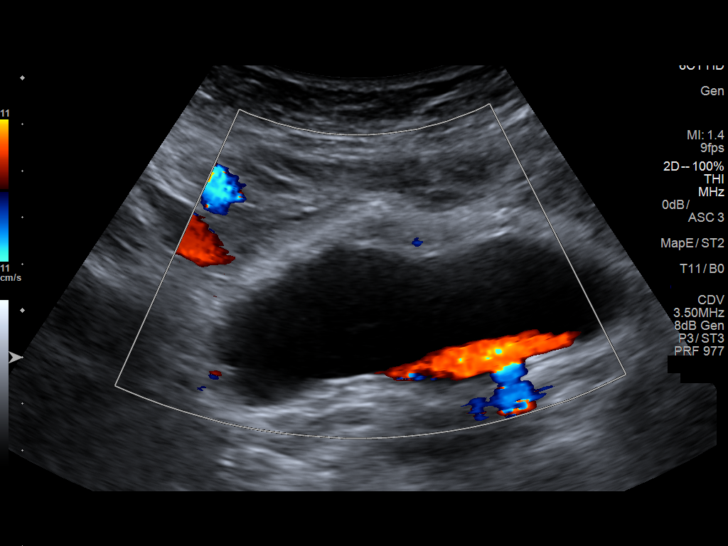

[14 of 25 positions shown; findings below may reference images not displayed]

FINDINGS: Right Kidney:

Length: 12.9 cm. Echogenicity within normal limits. No mass or
hydronephrosis visualized.

Left Kidney:

Length: 12.5 cm. Echogenicity within normal limits. No mass or
hydronephrosis visualized.

Bladder:

Diminutive. Appears normal for degree of bladder distention. Both
ureteral jets detected with Doppler.
IMPRESSION: Normal ultrasound appearance of both kidneys and the urinary
bladder.

## 2018-05-16 DIAGNOSIS — Z3A37 37 weeks gestation of pregnancy: Secondary | ICD-10-CM | POA: Diagnosis not present

## 2018-05-16 DIAGNOSIS — O368130 Decreased fetal movements, third trimester, not applicable or unspecified: Secondary | ICD-10-CM | POA: Diagnosis not present

## 2018-05-17 DIAGNOSIS — O99283 Endocrine, nutritional and metabolic diseases complicating pregnancy, third trimester: Secondary | ICD-10-CM | POA: Diagnosis not present

## 2018-05-17 DIAGNOSIS — E282 Polycystic ovarian syndrome: Secondary | ICD-10-CM | POA: Diagnosis not present

## 2018-05-17 DIAGNOSIS — Z8659 Personal history of other mental and behavioral disorders: Secondary | ICD-10-CM | POA: Diagnosis not present

## 2018-05-17 DIAGNOSIS — Z3A37 37 weeks gestation of pregnancy: Secondary | ICD-10-CM | POA: Diagnosis not present

## 2018-05-17 DIAGNOSIS — Z79899 Other long term (current) drug therapy: Secondary | ICD-10-CM | POA: Diagnosis not present

## 2018-05-21 DIAGNOSIS — Z3A37 37 weeks gestation of pregnancy: Secondary | ICD-10-CM | POA: Diagnosis not present

## 2018-05-24 DIAGNOSIS — O99344 Other mental disorders complicating childbirth: Secondary | ICD-10-CM | POA: Diagnosis not present

## 2018-05-24 DIAGNOSIS — Z0542 Observation and evaluation of newborn for suspected metabolic condition ruled out: Secondary | ICD-10-CM | POA: Diagnosis not present

## 2018-05-24 DIAGNOSIS — F329 Major depressive disorder, single episode, unspecified: Secondary | ICD-10-CM | POA: Diagnosis not present

## 2018-05-24 DIAGNOSIS — Z23 Encounter for immunization: Secondary | ICD-10-CM | POA: Diagnosis not present

## 2018-05-24 DIAGNOSIS — O9962 Diseases of the digestive system complicating childbirth: Secondary | ICD-10-CM | POA: Diagnosis not present

## 2018-05-24 DIAGNOSIS — Z3A38 38 weeks gestation of pregnancy: Secondary | ICD-10-CM | POA: Diagnosis not present

## 2018-05-24 DIAGNOSIS — Z79899 Other long term (current) drug therapy: Secondary | ICD-10-CM | POA: Diagnosis not present

## 2018-05-24 DIAGNOSIS — K219 Gastro-esophageal reflux disease without esophagitis: Secondary | ICD-10-CM | POA: Diagnosis not present

## 2018-05-25 DIAGNOSIS — Z3A38 38 weeks gestation of pregnancy: Secondary | ICD-10-CM | POA: Diagnosis not present

## 2018-05-27 DIAGNOSIS — F53 Postpartum depression: Secondary | ICD-10-CM | POA: Diagnosis not present

## 2018-05-27 DIAGNOSIS — O99345 Other mental disorders complicating the puerperium: Secondary | ICD-10-CM | POA: Diagnosis not present

## 2018-05-29 DIAGNOSIS — O99345 Other mental disorders complicating the puerperium: Secondary | ICD-10-CM | POA: Diagnosis not present

## 2018-05-29 DIAGNOSIS — F53 Postpartum depression: Secondary | ICD-10-CM | POA: Diagnosis not present

## 2018-06-02 IMAGING — DX DG SINUSES COMPLETE 3+V
3 series · 3 of 3 positions shown · non-contrast
Comparison: None.

CLINICAL DATA: Right ear ringing for 2 weeks. Bilateral sinus
pressure.

EXAM:
PARANASAL SINUSES - COMPLETE 3 + VIEW

[pns waters]
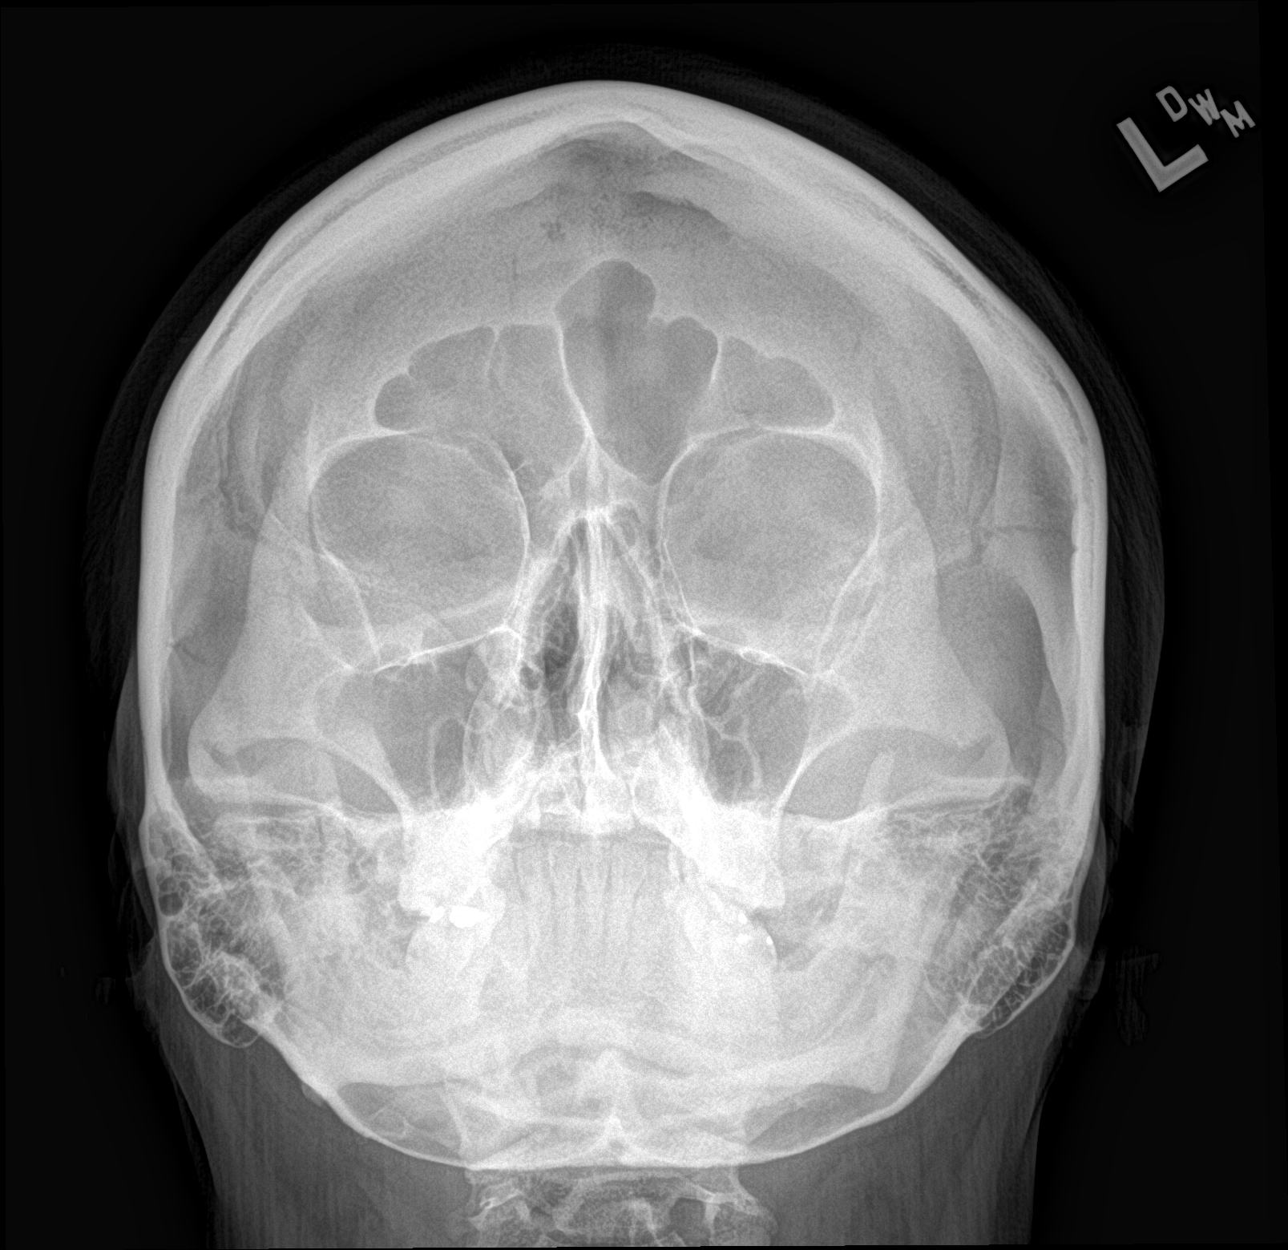

[[person_name]]
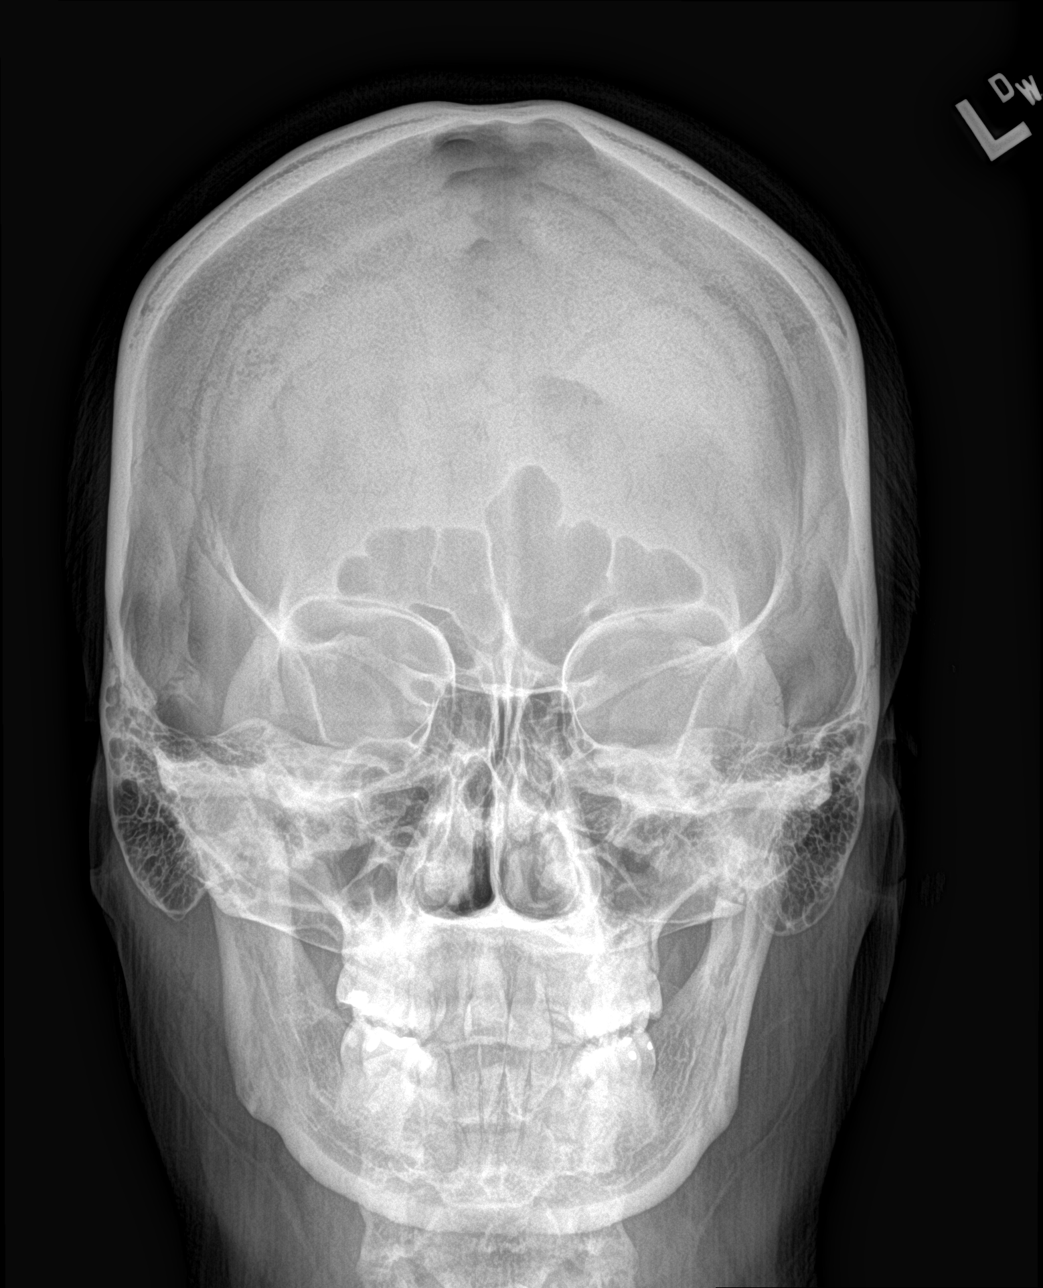

[pns lat]
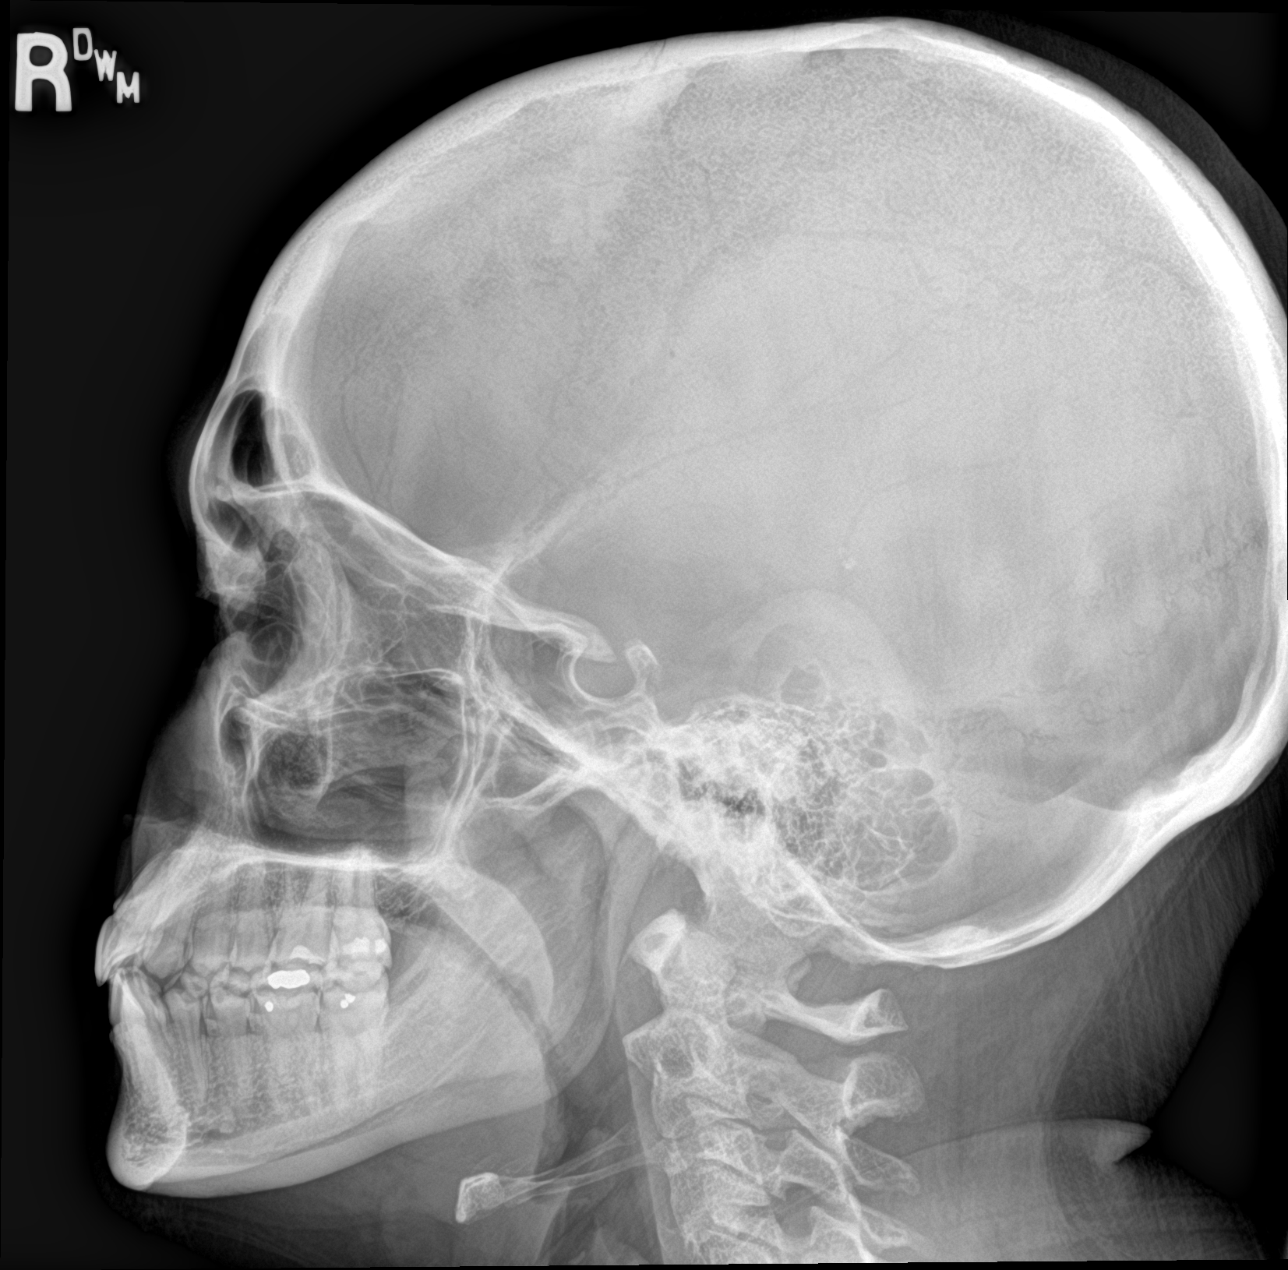

[3 of 3 positions shown; findings below may reference images not displayed]

FINDINGS: The paranasal sinus are aerated. There is no evidence of sinus
opacification air-fluid levels or mucosal thickening. No significant
bone abnormalities are seen.
IMPRESSION: Negative.

## 2018-06-03 DIAGNOSIS — F53 Postpartum depression: Secondary | ICD-10-CM | POA: Diagnosis not present

## 2018-06-03 DIAGNOSIS — O99345 Other mental disorders complicating the puerperium: Secondary | ICD-10-CM | POA: Diagnosis not present

## 2018-06-05 DIAGNOSIS — F53 Postpartum depression: Secondary | ICD-10-CM | POA: Diagnosis not present

## 2018-06-05 DIAGNOSIS — O99345 Other mental disorders complicating the puerperium: Secondary | ICD-10-CM | POA: Diagnosis not present

## 2018-06-07 DIAGNOSIS — F53 Postpartum depression: Secondary | ICD-10-CM | POA: Diagnosis not present

## 2018-06-07 DIAGNOSIS — O99345 Other mental disorders complicating the puerperium: Secondary | ICD-10-CM | POA: Diagnosis not present

## 2018-06-10 DIAGNOSIS — O99345 Other mental disorders complicating the puerperium: Secondary | ICD-10-CM | POA: Diagnosis not present

## 2018-06-10 DIAGNOSIS — F53 Postpartum depression: Secondary | ICD-10-CM | POA: Diagnosis not present

## 2018-07-31 DIAGNOSIS — H93A1 Pulsatile tinnitus, right ear: Secondary | ICD-10-CM | POA: Diagnosis not present

## 2018-07-31 DIAGNOSIS — S00412A Abrasion of left ear, initial encounter: Secondary | ICD-10-CM | POA: Diagnosis not present

## 2018-08-14 DIAGNOSIS — H93A1 Pulsatile tinnitus, right ear: Secondary | ICD-10-CM | POA: Diagnosis not present

## 2018-08-14 DIAGNOSIS — R9402 Abnormal brain scan: Secondary | ICD-10-CM | POA: Diagnosis not present

## 2018-09-02 DIAGNOSIS — M9914 Subluxation complex (vertebral) of sacral region: Secondary | ICD-10-CM | POA: Diagnosis not present

## 2018-09-02 DIAGNOSIS — M545 Low back pain: Secondary | ICD-10-CM | POA: Diagnosis not present

## 2018-09-02 DIAGNOSIS — M9905 Segmental and somatic dysfunction of pelvic region: Secondary | ICD-10-CM | POA: Diagnosis not present

## 2018-09-02 DIAGNOSIS — M9903 Segmental and somatic dysfunction of lumbar region: Secondary | ICD-10-CM | POA: Diagnosis not present

## 2018-09-04 DIAGNOSIS — M5382 Other specified dorsopathies, cervical region: Secondary | ICD-10-CM | POA: Diagnosis not present

## 2018-09-04 DIAGNOSIS — E282 Polycystic ovarian syndrome: Secondary | ICD-10-CM | POA: Diagnosis not present

## 2018-09-04 DIAGNOSIS — M9903 Segmental and somatic dysfunction of lumbar region: Secondary | ICD-10-CM | POA: Diagnosis not present

## 2018-09-04 DIAGNOSIS — M9901 Segmental and somatic dysfunction of cervical region: Secondary | ICD-10-CM | POA: Diagnosis not present

## 2018-09-04 DIAGNOSIS — M545 Low back pain: Secondary | ICD-10-CM | POA: Diagnosis not present

## 2018-09-04 DIAGNOSIS — N86 Erosion and ectropion of cervix uteri: Secondary | ICD-10-CM | POA: Diagnosis not present

## 2018-09-10 ENCOUNTER — Encounter: Payer: BLUE CROSS/BLUE SHIELD | Admitting: Physician Assistant

## 2018-09-17 ENCOUNTER — Encounter: Payer: BC Managed Care – PPO | Admitting: Physician Assistant

## 2018-09-17 DIAGNOSIS — M5382 Other specified dorsopathies, cervical region: Secondary | ICD-10-CM | POA: Diagnosis not present

## 2018-09-17 DIAGNOSIS — M9901 Segmental and somatic dysfunction of cervical region: Secondary | ICD-10-CM | POA: Diagnosis not present

## 2018-09-17 DIAGNOSIS — M9903 Segmental and somatic dysfunction of lumbar region: Secondary | ICD-10-CM | POA: Diagnosis not present

## 2018-09-17 DIAGNOSIS — M545 Low back pain: Secondary | ICD-10-CM | POA: Diagnosis not present

## 2018-09-23 ENCOUNTER — Ambulatory Visit (INDEPENDENT_AMBULATORY_CARE_PROVIDER_SITE_OTHER): Payer: BC Managed Care – PPO | Admitting: Physician Assistant

## 2018-09-23 ENCOUNTER — Other Ambulatory Visit: Payer: Self-pay

## 2018-09-23 ENCOUNTER — Encounter: Payer: Self-pay | Admitting: Physician Assistant

## 2018-09-23 VITALS — BP 111/63 | HR 92 | Ht 64.0 in | Wt 170.7 lb

## 2018-09-23 DIAGNOSIS — Z0000 Encounter for general adult medical examination without abnormal findings: Secondary | ICD-10-CM

## 2018-09-23 DIAGNOSIS — Z13220 Encounter for screening for lipoid disorders: Secondary | ICD-10-CM

## 2018-09-23 DIAGNOSIS — E282 Polycystic ovarian syndrome: Secondary | ICD-10-CM | POA: Diagnosis not present

## 2018-09-23 DIAGNOSIS — Z131 Encounter for screening for diabetes mellitus: Secondary | ICD-10-CM

## 2018-09-23 DIAGNOSIS — E663 Overweight: Secondary | ICD-10-CM

## 2018-09-23 DIAGNOSIS — F53 Postpartum depression: Secondary | ICD-10-CM

## 2018-09-23 DIAGNOSIS — O99345 Other mental disorders complicating the puerperium: Secondary | ICD-10-CM

## 2018-09-23 MED ORDER — SERTRALINE HCL 100 MG PO TABS: 100.0000 mg | ORAL_TABLET | Freq: Every day | ORAL | 0 refills | Status: DC

## 2018-09-23 NOTE — Patient Instructions (Signed)
Health Maintenance, Female Adopting a healthy lifestyle and getting preventive care are important in promoting health and wellness. Ask your health care provider about:  The right schedule for you to have regular tests and exams.  Things you can do on your own to prevent diseases and keep yourself healthy. What should I know about diet, weight, and exercise? Eat a healthy diet   Eat a diet that includes plenty of vegetables, fruits, low-fat dairy products, and lean protein.  Do not eat a lot of foods that are high in solid fats, added sugars, or sodium. Maintain a healthy weight Body mass index (BMI) is used to identify weight problems. It estimates body fat based on height and weight. Your health care provider can help determine your BMI and help you achieve or maintain a healthy weight. Get regular exercise Get regular exercise. This is one of the most important things you can do for your health. Most adults should:  Exercise for at least 150 minutes each week. The exercise should increase your heart rate and make you sweat (moderate-intensity exercise).  Do strengthening exercises at least twice a week. This is in addition to the moderate-intensity exercise.  Spend less time sitting. Even light physical activity can be beneficial. Watch cholesterol and blood lipids Have your blood tested for lipids and cholesterol at 29 years of age, then have this test every 5 years. Have your cholesterol levels checked more often if:  Your lipid or cholesterol levels are high.  You are older than 29 years of age.  You are at high risk for heart disease. What should I know about cancer screening? Depending on your health history and family history, you may need to have cancer screening at various ages. This may include screening for:  Breast cancer.  Cervical cancer.  Colorectal cancer.  Skin cancer.  Lung cancer. What should I know about heart disease, diabetes, and high blood  pressure? Blood pressure and heart disease  High blood pressure causes heart disease and increases the risk of stroke. This is more likely to develop in people who have high blood pressure readings, are of African descent, or are overweight.  Have your blood pressure checked: ? Every 3-5 years if you are 18-39 years of age. ? Every year if you are 40 years old or older. Diabetes Have regular diabetes screenings. This checks your fasting blood sugar level. Have the screening done:  Once every three years after age 40 if you are at a normal weight and have a low risk for diabetes.  More often and at a younger age if you are overweight or have a high risk for diabetes. What should I know about preventing infection? Hepatitis B If you have a higher risk for hepatitis B, you should be screened for this virus. Talk with your health care provider to find out if you are at risk for hepatitis B infection. Hepatitis C Testing is recommended for:  Everyone born from 1945 through 1965.  Anyone with known risk factors for hepatitis C. Sexually transmitted infections (STIs)  Get screened for STIs, including gonorrhea and chlamydia, if: ? You are sexually active and are younger than 29 years of age. ? You are older than 29 years of age and your health care provider tells you that you are at risk for this type of infection. ? Your sexual activity has changed since you were last screened, and you are at increased risk for chlamydia or gonorrhea. Ask your health care provider if   you are at risk.  Ask your health care provider about whether you are at high risk for HIV. Your health care provider may recommend a prescription medicine to help prevent HIV infection. If you choose to take medicine to prevent HIV, you should first get tested for HIV. You should then be tested every 3 months for as long as you are taking the medicine. Pregnancy  If you are about to stop having your period (premenopausal) and  you may become pregnant, seek counseling before you get pregnant.  Take 400 to 800 micrograms (mcg) of folic acid every day if you become pregnant.  Ask for birth control (contraception) if you want to prevent pregnancy. Osteoporosis and menopause Osteoporosis is a disease in which the bones lose minerals and strength with aging. This can result in bone fractures. If you are 65 years old or older, or if you are at risk for osteoporosis and fractures, ask your health care provider if you should:  Be screened for bone loss.  Take a calcium or vitamin D supplement to lower your risk of fractures.  Be given hormone replacement therapy (HRT) to treat symptoms of menopause. Follow these instructions at home: Lifestyle  Do not use any products that contain nicotine or tobacco, such as cigarettes, e-cigarettes, and chewing tobacco. If you need help quitting, ask your health care provider.  Do not use street drugs.  Do not share needles.  Ask your health care provider for help if you need support or information about quitting drugs. Alcohol use  Do not drink alcohol if: ? Your health care provider tells you not to drink. ? You are pregnant, may be pregnant, or are planning to become pregnant.  If you drink alcohol: ? Limit how much you use to 0-1 drink a day. ? Limit intake if you are breastfeeding.  Be aware of how much alcohol is in your drink. In the [place] one drink equals one 12 oz bottle of beer (355 mL), one 5 oz glass of wine (148 mL), or one 1 oz glass of hard liquor (44 mL). General instructions  Schedule regular health, dental, and eye exams.  Stay current with your vaccines.  Tell your health care provider if: ? You often feel depressed. ? You have ever been abused or do not feel safe at home. Summary  Adopting a healthy lifestyle and getting preventive care are important in promoting health and wellness.  Follow your health care provider's instructions about healthy  diet, exercising, and getting tested or screened for diseases.  Follow your health care provider's instructions on monitoring your cholesterol and blood pressure. This information is not intended to replace advice given to you by your health care provider. Make sure you discuss any questions you have with your health care provider. Document Released: 08/22/2010 Document Revised: 01/30/2018 Document Reviewed: 01/30/2018 Elsevier Patient Education  2020 [employer]  
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
## 2018-09-24 DIAGNOSIS — F53 Postpartum depression: Secondary | ICD-10-CM | POA: Insufficient documentation

## 2018-09-24 DIAGNOSIS — E663 Overweight: Secondary | ICD-10-CM | POA: Insufficient documentation

## 2018-09-24 DIAGNOSIS — E282 Polycystic ovarian syndrome: Secondary | ICD-10-CM | POA: Insufficient documentation

## 2018-09-26 DIAGNOSIS — M545 Low back pain: Secondary | ICD-10-CM | POA: Diagnosis not present

## 2018-09-26 DIAGNOSIS — M9903 Segmental and somatic dysfunction of lumbar region: Secondary | ICD-10-CM | POA: Diagnosis not present

## 2018-09-26 DIAGNOSIS — M5382 Other specified dorsopathies, cervical region: Secondary | ICD-10-CM | POA: Diagnosis not present

## 2018-09-26 DIAGNOSIS — M9901 Segmental and somatic dysfunction of cervical region: Secondary | ICD-10-CM | POA: Diagnosis not present

## 2018-10-04 DIAGNOSIS — Z0000 Encounter for general adult medical examination without abnormal findings: Secondary | ICD-10-CM | POA: Diagnosis not present

## 2018-10-04 DIAGNOSIS — E282 Polycystic ovarian syndrome: Secondary | ICD-10-CM | POA: Diagnosis not present

## 2018-10-04 DIAGNOSIS — Z131 Encounter for screening for diabetes mellitus: Secondary | ICD-10-CM | POA: Diagnosis not present

## 2018-10-04 DIAGNOSIS — Z13220 Encounter for screening for lipoid disorders: Secondary | ICD-10-CM | POA: Diagnosis not present

## 2018-10-05 LAB — COMPLETE METABOLIC PANEL WITH GFR
AG Ratio: 1.9 (calc) (ref 1.0–2.5)
ALT: 32 U/L — ABNORMAL HIGH (ref 6–29)
AST: 24 U/L (ref 10–30)
Albumin: 4.3 g/dL (ref 3.6–5.1)
Alkaline phosphatase (APISO): 57 U/L (ref 31–125)
BUN: 9 mg/dL (ref 7–25)
CO2: 25 mmol/L (ref 20–32)
Calcium: 9.2 mg/dL (ref 8.6–10.2)
Chloride: 104 mmol/L (ref 98–110)
Creat: 0.5 mg/dL (ref 0.50–1.10)
GFR, Est African American: 153 mL/min/{1.73_m2} (ref >=60)
GFR, Est Non African American: 132 mL/min/{1.73_m2} (ref >=60)
Globulin: 2.3 g/dL (calc) (ref 1.9–3.7)
Glucose, Bld: 91 mg/dL (ref 65–99)
Potassium: 3.9 mmol/L (ref 3.5–5.3)
Sodium: 137 mmol/L (ref 135–146)
Total Bilirubin: 0.6 mg/dL (ref 0.2–1.2)
Total Protein: 6.6 g/dL (ref 6.1–8.1)

## 2018-10-05 LAB — HEMOGLOBIN A1C
Hgb A1c MFr Bld: 5.1 % of total Hgb (ref <5.7)
Mean Plasma Glucose: 100 (calc)
eAG (mmol/L): 5.5 (calc)

## 2018-10-05 LAB — LIPID PANEL W/REFLEX DIRECT LDL
Cholesterol: 176 mg/dL (ref <200)
HDL: 49 mg/dL — ABNORMAL LOW (ref >=50)
LDL Cholesterol (Calc): 108 mg/dL (calc) — ABNORMAL HIGH
Non-HDL Cholesterol (Calc): 127 mg/dL (calc) (ref <130)
Total CHOL/HDL Ratio: 3.6 (calc) (ref <5.0)
Triglycerides: 93 mg/dL (ref <150)

## 2018-10-07 NOTE — Progress Notes (Signed)
Call pt: cholesterol looks good. Kidney and sugars look good.  A1c better than last check.  One liver enzyme a little elevated. Could be due to weight gain around liver/alcohol/tylenol intake. Recheck in 1 month to make sure not trending up.

## 2018-10-08 ENCOUNTER — Other Ambulatory Visit: Payer: Self-pay | Admitting: Neurology

## 2018-10-08 DIAGNOSIS — R748 Abnormal levels of other serum enzymes: Secondary | ICD-10-CM

## 2018-10-15 DIAGNOSIS — M5136 Other intervertebral disc degeneration, lumbar region: Secondary | ICD-10-CM | POA: Diagnosis not present

## 2018-10-15 DIAGNOSIS — M546 Pain in thoracic spine: Secondary | ICD-10-CM | POA: Diagnosis not present

## 2018-10-15 DIAGNOSIS — M542 Cervicalgia: Secondary | ICD-10-CM | POA: Diagnosis not present

## 2018-10-15 DIAGNOSIS — M955 Acquired deformity of pelvis: Secondary | ICD-10-CM | POA: Diagnosis not present

## 2018-10-24 DIAGNOSIS — R509 Fever, unspecified: Secondary | ICD-10-CM | POA: Diagnosis not present

## 2018-10-24 DIAGNOSIS — Z20828 Contact with and (suspected) exposure to other viral communicable diseases: Secondary | ICD-10-CM | POA: Diagnosis not present

## 2018-10-24 DIAGNOSIS — J029 Acute pharyngitis, unspecified: Secondary | ICD-10-CM | POA: Diagnosis not present

## 2018-10-24 DIAGNOSIS — R52 Pain, unspecified: Secondary | ICD-10-CM | POA: Diagnosis not present

## 2018-12-16 ENCOUNTER — Other Ambulatory Visit: Payer: Self-pay | Admitting: Neurology

## 2018-12-16 DIAGNOSIS — O99345 Other mental disorders complicating the puerperium: Secondary | ICD-10-CM

## 2018-12-16 DIAGNOSIS — F53 Postpartum depression: Secondary | ICD-10-CM

## 2018-12-16 MED ORDER — SERTRALINE HCL 100 MG PO TABS: 100.0000 mg | ORAL_TABLET | Freq: Every day | ORAL | 0 refills | Status: DC

## 2019-01-14 ENCOUNTER — Encounter: Payer: Self-pay | Admitting: Physician Assistant

## 2019-01-14 ENCOUNTER — Telehealth: Payer: BC Managed Care – PPO | Admitting: Physician Assistant

## 2019-01-14 ENCOUNTER — Other Ambulatory Visit: Payer: Self-pay

## 2019-01-14 ENCOUNTER — Encounter: Payer: Self-pay | Admitting: Emergency Medicine

## 2019-01-14 ENCOUNTER — Emergency Department
Admission: EM | Admit: 2019-01-14 | Discharge: 2019-01-14 | Disposition: A | Discharge status: Home / Self Care | Payer: BC Managed Care – PPO | Source: Home / Self Care

## 2019-01-14 DIAGNOSIS — R52 Pain, unspecified: Secondary | ICD-10-CM

## 2019-01-14 DIAGNOSIS — B349 Viral infection, unspecified: Secondary | ICD-10-CM

## 2019-01-14 DIAGNOSIS — J029 Acute pharyngitis, unspecified: Secondary | ICD-10-CM | POA: Diagnosis not present

## 2019-01-14 DIAGNOSIS — R5383 Other fatigue: Secondary | ICD-10-CM

## 2019-01-14 DIAGNOSIS — R519 Headache, unspecified: Secondary | ICD-10-CM

## 2019-01-14 LAB — POCT MONO SCREEN (KUC): Mono, POC: NEGATIVE

## 2019-01-14 LAB — POCT RAPID STREP A (OFFICE): Rapid Strep A Screen: NEGATIVE

## 2019-01-14 MED ORDER — IBUPROFEN 600 MG PO TABS
600.0000 mg | ORAL_TABLET | Freq: Once | ORAL | Status: AC
  Administered 2019-01-14: 14:00:00 600 mg via ORAL

## 2019-01-14 MED ORDER — FLUTICASONE PROPIONATE 50 MCG/ACT NA SUSP: 2.0000 | Freq: Every day | NASAL | 6 refills | Status: DC

## 2019-01-14 MED ORDER — BENZONATATE 100 MG PO CAPS: 100.0000 mg | ORAL_CAPSULE | Freq: Two times a day (BID) | ORAL | 0 refills | Status: DC | PRN

## 2019-01-14 NOTE — ED Triage Notes (Signed)
Patient felt ill with fever and sore throat and aches 3 days ago; went for rapid covid test next day which was negative; no send out follow up and no other testing. No longer has fever but aches and fatigue persist.  No known contact with covid positive person; no travel.  Has not had influenza vacc this season.

## 2019-01-14 NOTE — Progress Notes (Signed)
Mrs. Arel,  I went through your questionnaire  and also saw the communication with your primary doctor. I completely understand your concern. We are most concerned with COVID 19 at the moment but the flu can be miserable and concerning as well. This is another viral illness we should keep in mind.   I don't see results in our system for you covid test so I am assuming it was done somewhere other than through [facility]. Some of the covid test, especially the rapid covid test are not perfectly accurate and they can have falsely negative results. You could still have covid depending on what test you had.   I would continue to treat this symptomatically as noted in the information below. You can follow up with your primary care provider for influenza testing as they find appropriate.      Based on what you have shared with me, it looks like you may have a viral upper respiratory infection.  Upper respiratory infections are caused by a large number of viruses; however, rhinovirus is the most common cause.   Symptoms vary from person to person, with common symptoms including sore throat, cough, fatigue or lack of energy and feeling of general discomfort.  A low-grade fever of up to 100.4 may present, but is often uncommon.  Symptoms vary however, and are closely related to a person's age or underlying illnesses.  The most common symptoms associated with an upper respiratory infection are nasal discharge or congestion, cough, sneezing, headache and pressure in the ears and face.  These symptoms usually persist for about 3 to 10 days, but can last up to 2 weeks.  It is important to know that upper respiratory infections do not cause serious illness or complications in most cases.    Upper respiratory infections can be transmitted from person to person, with the most common method of transmission being a person's hands.  The virus is able to live on the skin and can infect other persons for up to 2 hours after  direct contact.  Also, these can be transmitted when someone coughs or sneezes; thus, it is important to cover the mouth to reduce this risk.  To keep the spread of the illness at [place], good hand hygiene is very important.  This is an infection that is most likely caused by a virus. There are no specific treatments other than to help you with the symptoms until the infection runs its course.  We are sorry you are not feeling well.  Here is how we plan to help!   For nasal congestion, you may use an oral decongestants such as Mucinex D or if you have glaucoma or high blood pressure use plain Mucinex.  Saline nasal spray or nasal drops can help and can safely be used as often as needed for congestion.  For your congestion, I have prescribed Fluticasone nasal spray one spray in each nostril twice a day  If you do not have a history of heart disease, hypertension, diabetes or thyroid disease, prostate/bladder issues or glaucoma, you may also use Sudafed to treat nasal congestion.  It is highly recommended that you consult with a pharmacist or your primary care physician to ensure this medication is safe for you to take.     If you have a cough, you may use cough suppressants such as Delsym and Robitussin.  If you have glaucoma or high blood pressure, you can also use Coricidin HBP.   For cough I have prescribed for  you A prescription cough medication called Tessalon Perles 100 mg. You may take 1-2 capsules every 8 hours as needed for cough  If you have a sore or scratchy throat, use a saltwater gargle-  to  teaspoon of salt dissolved in a 4-ounce to 8-ounce glass of warm water.  Gargle the solution for approximately 15-30 seconds and then spit.  It is important not to swallow the solution.  You can also use throat lozenges/cough drops and Chloraseptic spray to help with throat pain or discomfort.  Warm or cold liquids can also be helpful in relieving throat pain.  For headache, pain or general discomfort,  you can use Ibuprofen or Tylenol as directed.   Some authorities believe that zinc sprays or the use of Echinacea may shorten the course of your symptoms.   HOME CARE . Only take medications as instructed by your medical team. . Be sure to drink plenty of fluids. Water is fine as well as fruit juices, sodas and electrolyte beverages. You may want to stay away from caffeine or alcohol. If you are nauseated, try taking small sips of liquids. How do you know if you are getting enough fluid? Your urine should be a pale yellow or almost colorless. . Get rest. . Taking a steamy shower or using a humidifier may help nasal congestion and ease sore throat pain. You can place a towel over your head and breathe in the steam from hot water coming from a faucet. . Using a saline nasal spray works much the same way. . Cough drops, hard candies and sore throat lozenges may ease your cough. . Avoid close contacts especially the very young and the elderly . Cover your mouth if you cough or sneeze . Always remember to wash your hands.   GET HELP RIGHT AWAY IF: . You develop worsening fever. . If your symptoms do not improve within 10 days . You develop yellow or green discharge from your nose over 3 days. . You have coughing fits . You develop a severe head ache or visual changes. . You develop shortness of breath, difficulty breathing or start having chest pain . Your symptoms persist after you have completed your treatment plan  MAKE SURE YOU   Understand these instructions.  Will watch your condition.  Will get help right away if you are not doing well or get worse.  Your e-visit answers were reviewed by a board certified advanced clinical practitioner to complete your personal care plan. Depending upon the condition, your plan could have included both over the counter or prescription medications. Please review your pharmacy choice. If there is a problem, you may call our nursing hot line at and  have the prescription routed to another pharmacy. Your safety is important to [place]. If you have drug allergies check your prescription carefully.   You can use MyChart to ask questions about today's visit, request a non-urgent call back, or ask for a work or school excuse for 24 hours related to this e-Visit. If it has been greater than 24 hours you will need to follow up with your provider, or enter a new e-Visit to address those concerns. You will get an e-mail in the next two days asking about your experience.  I hope that your e-visit has been valuable and will speed your recovery. Thank you for using e-visits.  Greater than 5 minutes, yet less than 10 minutes of time have been spent researching, coordinating, and implementing care for this patient today

## 2019-01-14 NOTE — ED Provider Notes (Signed)
[facility]    CSN: [id number] Arrival date & time: 01/14/19  1229      History   Chief Complaint Chief Complaint  Patient presents with  . Fever  . Sore Throat  . Generalized Body Aches  . Fatigue    HPI Melissa Alexander is a 28 y.o. female.   HPI Melissa Alexander is a 28 y.o. female presenting to UC with c/o body aches, sore throat, and HA. She had a fever the first 2 days, Tmax 102*F. Fever has resolved but she still feels bad.  She tested negative for Covid 2 days ago via a rapid test. She did an E-visit with her PCP this morning, who recommended she be evaluated in person and to be retested for covid as well as tested for flu and strep.  Denies n/v/d. No chest pain or SOB. She has not received the flu vaccine this season.  Last dose of tylenol was around 6AM this morning. No known sick contacts or recent travel.   Past Medical History:  Diagnosis Date  . Allergy   . Depression    postpartum  . PCOS (polycystic ovarian syndrome)     Patient Active Problem List   Diagnosis Date Noted  . Overweight (BMI 25.0-29.9) 09/24/2018  . PCOS (polycystic ovarian syndrome) 09/24/2018  . Post partum depression 09/24/2018  . Tinnitus of right ear 12/24/2016  . Sinus pressure 12/24/2016  . Pulsatile tinnitus of right ear 11/30/2016  . Elevated serum creatinine 11/30/2016  . AKI (acute kidney injury) (HCC) 11/01/2016  . Polyphagia 02/25/2016  . Leukocytosis 02/25/2016  . Rhinitis, allergic 06/29/2015  . Seasonal allergies 06/29/2015  . Other migraine with status migrainosus, intractable 06/04/2014  . Hemorrhoid 04/08/2014    History reviewed. No pertinent surgical history.  OB History    Gravida  1   Para      Term      Preterm      AB      Living        SAB      TAB      Ectopic      Multiple      Live Births               Home Medications    Prior to Admission medications   Medication Sig Start Date End Date Taking? Authorizing  Provider  benzonatate (TESSALON) 100 MG capsule Take 1 capsule (100 mg total) by mouth 2 (two) times daily as needed for cough. 01/14/19   Hedges, Tinnie GensJeffrey, PA-C  fluticasone (FLONASE) 50 MCG/ACT nasal spray Place 2 sprays into both nostrils daily. 01/14/19   Hedges, Tinnie GensJeffrey, PA-C  metFORMIN (GLUCOPHAGE-XR) 500 MG 24 hr tablet Take 1 tablet by mouth 2 (two) times daily. 09/12/17   [provider]  sertraline (ZOLOFT) 100 MG tablet Take 1 tablet (100 mg total) by mouth daily. NEEDS APPT 12/16/18   Tandy GawBreeback, Jade L, PA-C  escitalopram (LEXAPRO) 20 MG tablet Take 20 mg by mouth daily.  03/06/14  [provider]    Family History Family History  Problem Relation Age of Onset  . Diabetes Mother   . Hypertension Mother   . Aneurysm Mother   . Hyperlipidemia Mother   . Cancer Father        skin  . Heart failure Father   . Hyperlipidemia Father   . Hypertension Father   . Cancer Paternal Aunt   . Stroke Maternal Grandmother   . Stroke Paternal Grandfather  Social History Social History   Tobacco Use  . Smoking status: Never Smoker  . Smokeless tobacco: Never Used  Substance Use Topics  . Alcohol use: No  . Drug use: No     Allergies   Patient has no known allergies.   Review of Systems Review of Systems  Constitutional: Positive for fever. Negative for chills.  HENT: Positive for congestion and sore throat. Negative for ear pain, trouble swallowing and voice change.   Respiratory: Positive for cough. Negative for shortness of breath.   Cardiovascular: Negative for chest pain and palpitations.  Gastrointestinal: Negative for abdominal pain, diarrhea, nausea and vomiting.  Musculoskeletal:       Body aches  Skin: Negative for rash.  Neurological: Positive for headaches. Negative for dizziness and light-headedness.     Physical Exam Triage Vital Signs ED Triage Vitals  Enc Vitals Group     BP 01/14/19 1334 122/75     Pulse Rate 01/14/19 1334 91      Resp 01/14/19 1334 16     Temp 01/14/19 1334 98.6 F (37 C)     Temp Source 01/14/19 1334 Oral     SpO2 01/14/19 1334 99 %     Weight --      Height --      Head Circumference --      Peak Flow --      Pain Score 01/14/19 1339 8     Pain Loc --      Pain Edu? --      Excl. in [place]? --    No data found.  Updated Vital Signs BP 122/75 (BP Location: Right Arm)   Pulse 91   Temp 98.6 F (37 C) (Oral)   Resp 16   LMP 01/09/2019 (Exact Date)   SpO2 99%   Visual Acuity Right Eye Distance:   Left Eye Distance:   Bilateral Distance:    Right Eye Near:   Left Eye Near:    Bilateral Near:     Physical Exam Vitals signs and nursing note reviewed.  Constitutional:      General: She is not in acute distress.    Appearance: She is well-developed. She is not ill-appearing, toxic-appearing or diaphoretic.  HENT:     Head: Normocephalic and atraumatic.     Right Ear: Tympanic membrane and ear canal normal.     Left Ear: Tympanic membrane and ear canal normal.     Nose: Nose normal.     Right Sinus: No maxillary sinus tenderness or frontal sinus tenderness.     Left Sinus: No maxillary sinus tenderness or frontal sinus tenderness.     Mouth/Throat:     Lips: Pink.     Mouth: Mucous membranes are moist.     Pharynx: Oropharynx is clear. Uvula midline. Posterior oropharyngeal erythema (mild) present. No pharyngeal swelling, oropharyngeal exudate or uvula swelling.  Neck:     Musculoskeletal: Normal range of motion and neck supple.  Cardiovascular:     Rate and Rhythm: Normal rate and regular rhythm.  Pulmonary:     Effort: Pulmonary effort is normal. No respiratory distress.     Breath sounds: Normal breath sounds. No stridor. No wheezing, rhonchi or rales.  Musculoskeletal: Normal range of motion.  Lymphadenopathy:     Cervical: No cervical adenopathy.  Skin:    General: Skin is warm and dry.  Neurological:     Mental Status: She is alert and oriented to person, place, and  time.  Psychiatric:  Behavior: Behavior normal.      UC Treatments / Results  Labs (all labs ordered are listed, but only abnormal results are displayed) Labs Reviewed  SARS-COV-2 RNA,(COVID-19) QUALITATIVE NAAT  STREP A DNA PROBE  POCT RAPID STREP A (OFFICE)  POCT INFLUENZA A/B  POCT MONO SCREEN ([facility])    EKG   Radiology No results found.  Procedures Procedures (including critical care time)  Medications Ordered in UC Medications  ibuprofen (ADVIL) tablet 600 mg (600 mg Oral Given 01/14/19 1415)    Initial Impression / Assessment and Plan / UC Course  I have reviewed the triage vital signs and the nursing notes.  Pertinent labs & imaging results that were available during my care of the patient were reviewed by me and considered in my medical decision making (see chart for details).     Rapid flu, strep and mono: NEGATIVE Covid-19 send out test: Pending Encouraged symptomatic tx Reassuring fever has resolved AVS provided  Final Clinical Impressions(s) / UC Diagnoses   Final diagnoses:  Sore throat  Generalized headache  Fatigue, unspecified type  Body aches     Discharge Instructions      You may take 500mg  acetaminophen every 4-6 hours or in combination with ibuprofen 400-600mg  every 6-8 hours as needed for pain, inflammation, and fever.  Be sure to well hydrated with clear liquids and get at least 8 hours of sleep at night, preferably more while sick.   Please follow up with family medicine in 1 week if needed.   Due to concern for possibly having Covid-19, it is advised that you self-isolate at home until test results come back in 2-5 days.  If positive, it is recommended you stay isolated for at least 10 days after symptom onset and 24 hours after last fever without taking medication (whichever is longer), with improving symptoms.  If you MUST go out, please wear a mask at all times, limit contact with others.       ED Prescriptions     None     PDMP not reviewed this encounter.   , Lurene Shadow 01/14/19 1523

## 2019-01-14 NOTE — Discharge Instructions (Signed)
°  You may take 500mg acetaminophen every 4-6 hours or in combination with ibuprofen 400-600mg every 6-8 hours as needed for pain, inflammation, and fever. ° °Be sure to well hydrated with clear liquids and get at least 8 hours of sleep at night, preferably more while sick.  ° °Please follow up with family medicine in 1 week if needed. ° ° °Due to concern for possibly having Covid-19, it is advised that you self-isolate at home until test results come back in 2-5 days.  If positive, it is recommended you stay isolated for at least 10 days after symptom onset and 24 hours after last fever without taking medication (whichever is longer), with improving symptoms.  If you MUST go out, please wear a mask at all times, limit contact with others.  ° °

## 2019-01-15 LAB — STREP A DNA PROBE: Group A Strep Probe: NOT DETECTED

## 2019-01-17 LAB — SARS-COV-2 RNA,(COVID-19) QUALITATIVE NAAT: SARS CoV2 RNA: NOT DETECTED

## 2019-01-23 LAB — POCT INFLUENZA A/B
Influenza A, POC: NEGATIVE
Influenza B, POC: NEGATIVE

## 2019-01-26 ENCOUNTER — Encounter: Payer: Self-pay | Admitting: Physician Assistant

## 2019-01-28 ENCOUNTER — Encounter: Payer: Self-pay | Admitting: Physician Assistant

## 2019-01-28 ENCOUNTER — Other Ambulatory Visit: Payer: Self-pay

## 2019-01-28 ENCOUNTER — Ambulatory Visit (INDEPENDENT_AMBULATORY_CARE_PROVIDER_SITE_OTHER): Payer: BC Managed Care – PPO | Admitting: Physician Assistant

## 2019-01-28 VITALS — BP 129/74 | HR 84 | Ht 64.0 in | Wt 177.0 lb

## 2019-01-28 DIAGNOSIS — R7301 Impaired fasting glucose: Secondary | ICD-10-CM

## 2019-01-28 DIAGNOSIS — F411 Generalized anxiety disorder: Secondary | ICD-10-CM

## 2019-01-28 DIAGNOSIS — H93A1 Pulsatile tinnitus, right ear: Secondary | ICD-10-CM | POA: Diagnosis not present

## 2019-01-28 DIAGNOSIS — F331 Major depressive disorder, recurrent, moderate: Secondary | ICD-10-CM | POA: Diagnosis not present

## 2019-01-28 DIAGNOSIS — Z23 Encounter for immunization: Secondary | ICD-10-CM

## 2019-01-28 DIAGNOSIS — H9201 Otalgia, right ear: Secondary | ICD-10-CM

## 2019-01-28 MED ORDER — SERTRALINE HCL 100 MG PO TABS: 150.0000 mg | ORAL_TABLET | Freq: Every day | ORAL | 3 refills | Status: DC

## 2019-01-28 MED ORDER — METHYLPREDNISOLONE 4 MG PO TBPK: ORAL_TABLET | ORAL | 0 refills | Status: DC

## 2019-01-28 NOTE — Progress Notes (Signed)
Subjective:    Patient ID: Melissa Alexander, female    DOB: 1991/01/09, 28 y.o.   MRN: [id number]  HPI  Patient is a 28 year old female with pulsatile tinnitus in the right ear, major depression, generalized anxiety who comes in for medication refill.  She continues to be very frustrated with her pulsatile tinnitus of right ear.  She is seeing ENT and an MRI was ordered.  It did show some structural abnormalities but no treatment initiated.  He did say she could look into the arterial side with another MRI but she has not called for follow-up at this time.  She has seen an audiologist and there is some slight hearing loss in the right ear.  She denies any dizziness.  She is currently having some new right ear pain that radiates into right jaw and neck.  She notices sharp pain when she drinks any alcohol.  She denies any fever, chills, cough, sore throat, loss of smell or taste, GI side effects, shortness of breath.  She does notice that her depression and anxiety have worsened.  She has been on Zoloft for quite a while.  She wonders if is not as effective anymore.  She did initially denies any side effects from Zoloft such as sexually.  She now has no motivation to have intercourse.  Her husband is becoming frustrated.  She denies any suicidal or homicidal thoughts.  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 No superimposed stricture or diverticulum.    .. Active Ambulatory Problems    Diagnosis Date Noted  . Hemorrhoid 04/08/2014  . Other migraine with status migrainosus, intractable 06/04/2014  . Rhinitis, allergic 06/29/2015  . Seasonal allergies 06/29/2015  . Polyphagia 02/25/2016  . Leukocytosis 02/25/2016  . AKI (acute kidney injury) (HCC) 11/01/2016  . Pulsatile tinnitus of right ear 11/30/2016  . Elevated serum creatinine 11/30/2016  . Tinnitus of right ear 12/24/2016  . Sinus pressure 12/24/2016  . Overweight  (BMI 25.0-29.9) 09/24/2018  . PCOS (polycystic ovarian syndrome) 09/24/2018  . Post partum depression 09/24/2018  . Moderate episode of recurrent major depressive disorder (HCC) 01/28/2019  . GAD (generalized anxiety disorder) 01/28/2019  . Elevated fasting glucose 01/28/2019   Resolved Ambulatory Problems    Diagnosis Date Noted  . Cough 01/02/2014  . Rectal bleeding 04/08/2014  . Bacterial conjunctivitis of left eye 06/29/2015   Past Medical History:  Diagnosis Date  . Allergy   . Depression       Review of Systems See HPI.     Objective:   Physical Exam Vitals signs reviewed.  Constitutional:      Appearance: Normal appearance.  HENT:     Head: Normocephalic.     Right Ear: External ear normal. There is no impacted cerumen.     Left Ear: Tympanic membrane, ear canal and external ear normal. There is no impacted cerumen.     Ears:     Comments: Right TM dull with dim light reflex.     Nose: Nose normal. No congestion.     Mouth/Throat:     Mouth: Mucous membranes are moist.  Eyes:     Extraocular Movements: Extraocular movements intact.     Pupils: Pupils are equal, round, and reactive to light.  Cardiovascular:     Rate and Rhythm: Normal rate and regular rhythm.     Pulses: Normal pulses.  Pulmonary:     Effort: Pulmonary effort is normal.     Breath  sounds: Normal breath sounds.  Lymphadenopathy:     Cervical: No cervical adenopathy.  Neurological:     General: No focal deficit present.     Mental Status: She is alert and oriented to person, place, and time.  Psychiatric:     Comments: Flat.        .. Depression screen [facility] 2/9 01/28/2019 09/23/2018 10/18/2016  Decreased Interest 2 1 1   Down, Depressed, Hopeless 2 1 1   PHQ - 2 Score 4 2 2   Altered sleeping 2 1 -  Tired, decreased energy 3 1 -  Change in appetite 2 2 -  Feeling bad or failure about yourself  2 1 -  Trouble concentrating 1 2 -  Moving slowly or fidgety/restless 1 2 -  Suicidal  thoughts 0 0 -  PHQ-9 Score 15 11 -  Difficult doing work/chores Extremely dIfficult Extremely dIfficult -   .. GAD 7 : Generalized Anxiety Score 01/28/2019 09/23/2018  Nervous, Anxious, on Edge 2 3  Control/stop worrying 2 3  Worry too much - different things 2 2  Trouble relaxing 2 3  Restless 1 2  Easily annoyed or irritable 2 2  Afraid - awful might happen 1 2  Total GAD 7 Score 12 17  Anxiety Difficulty Somewhat difficult Very difficult        Assessment & Plan:  Marland KitchenMarland KitchenMarrianne was seen today for ear pain.  Diagnoses and all orders for this visit:  Moderate episode of recurrent major depressive disorder (HCC) -     sertraline (ZOLOFT) 100 MG tablet; Take 1.5 tablets (150 mg total) by mouth daily.  Flu vaccine need -     REGULAR FLU SHOT  Pulsatile tinnitus of right ear -     CBC -     Ferritin -     Hemoglobin A1c -     COMPLETE METABOLIC PANEL WITH GFR -     TSH -     methylPREDNISolone (MEDROL DOSEPAK) 4 MG TBPK tablet; Take as directed by package insert.  Elevated fasting glucose -     Hemoglobin A1c  GAD (generalized anxiety disorder) -     sertraline (ZOLOFT) 100 MG tablet; Take 1.5 tablets (150 mg total) by mouth daily.  Right ear pain -     methylPREDNISolone (MEDROL DOSEPAK) 4 MG TBPK tablet; Take as directed by package insert.   PHQ-9 and GAD-7 have worsened.  I did discuss mood options.  We could switch Zoloft to something else, add Wellbutrin, increase Zoloft.  She decided we could increase Zoloft since she tolerated it so well.  Certainly at this point I do not think she is just now developing sexual side effects.  Likely her worsening depression is the reason why she is unmotivated for intercourse.  Increase Zoloft to 1.5 tablets daily.  Follow-up in 4 to 6 weeks.  We can certainly increase Zoloft again and/or add Wellbutrin at the next visit.  Encouraged exercise, meditation, self care.  Patient continues to have the pulsatile tinnitus.  At this point not  sure anything I can do can stop this.  Certainly we take a look at things she is doing and see if it is worsening it such as drinking alcohol.  I strongly advised her to follow-up with ENT again and get the arterial MRI to look for any reason to be having these symptoms.  She does have some actual right ear pain today.  I did see some fluid trapping behind the TM.  I suspect this  is some eustachian tube dysfunction.  I went ahead and sent a Medrol Dosepak over for patient.  She can certainly try Flonase for a few days before starting the Medrol Dosepak if she would like.  She could also try a few days of Sudafed.

## 2019-01-29 LAB — COMPLETE METABOLIC PANEL WITH GFR
AG Ratio: 2.1 (calc) (ref 1.0–2.5)
ALT: 25 U/L (ref 6–29)
AST: 21 U/L (ref 10–30)
Albumin: 4.6 g/dL (ref 3.6–5.1)
Alkaline phosphatase (APISO): 59 U/L (ref 31–125)
BUN: 9 mg/dL (ref 7–25)
CO2: 28 mmol/L (ref 20–32)
Calcium: 9.8 mg/dL (ref 8.6–10.2)
Chloride: 100 mmol/L (ref 98–110)
Creat: 0.51 mg/dL (ref 0.50–1.10)
GFR, Est African American: 151 mL/min/{1.73_m2} (ref >=60)
GFR, Est Non African American: 130 mL/min/{1.73_m2} (ref >=60)
Globulin: 2.2 g/dL (calc) (ref 1.9–3.7)
Glucose, Bld: 86 mg/dL (ref 65–99)
Potassium: 3.8 mmol/L (ref 3.5–5.3)
Sodium: 139 mmol/L (ref 135–146)
Total Bilirubin: 0.7 mg/dL (ref 0.2–1.2)
Total Protein: 6.8 g/dL (ref 6.1–8.1)

## 2019-01-29 LAB — HEMOGLOBIN A1C
Hgb A1c MFr Bld: 5.2 % of total Hgb (ref <5.7)
Mean Plasma Glucose: 103 (calc)
eAG (mmol/L): 5.7 (calc)

## 2019-01-29 LAB — CBC
HCT: 39.5 % (ref 35.0–45.0)
Hemoglobin: 13.4 g/dL (ref 11.7–15.5)
MCH: 28.9 pg (ref 27.0–33.0)
MCHC: 33.9 g/dL (ref 32.0–36.0)
MCV: 85.1 fL (ref 80.0–100.0)
MPV: 9.8 fL (ref 7.5–12.5)
Platelets: 269 10*3/uL (ref 140–400)
RBC: 4.64 10*6/uL (ref 3.80–5.10)
RDW: 12.3 % (ref 11.0–15.0)
WBC: 6.5 10*3/uL (ref 3.8–10.8)

## 2019-01-29 LAB — TSH: TSH: 1.27 mIU/L

## 2019-01-29 LAB — FERRITIN: Ferritin: 51 ng/mL (ref 16–154)

## 2019-01-29 NOTE — Progress Notes (Signed)
Melissa Alexander,   Normal hemoglobin. Normal ferritin. No evidence of any iron deficiency. Thyroid looks great. Normal A1C. Kidney, liver, glucose looks good.   [name]

## 2019-02-10 ENCOUNTER — Encounter: Payer: Self-pay | Admitting: Physician Assistant

## 2019-02-21 DIAGNOSIS — Z3490 Encounter for supervision of normal pregnancy, unspecified, unspecified trimester: Secondary | ICD-10-CM | POA: Diagnosis not present

## 2019-02-21 NOTE — L&D Delivery Note (Signed)
Delivery Note:   [id number] at [redacted]w[redacted]d  Admitting diagnosis: Normal labor [O80, Z37.9] Risks: COVID + in early pregnancy, PCOS on Metformin Onset of labor: 10/06/2019 @ 0045 IOL/Augmentation: AROM and Pitocin ROM: AROM clear @ 0427  Complete dilation at 10/06/2019  0838 Onset of pushing at 0838 FHR second stage Cat 2  Analgesia /Anesthesia intrapartum:Epidural  Pushing in lithotomy position with CNM and L&D staff support, friend present for birth and supportive.  Delivery of a Live born female  Birth Weight:  pending APGAR: 8, 9  Newborn Delivery   Birth date/time: 10/06/2019 08:42:00 Delivery type: Vaginal, Spontaneous   in cephalic presentation, position OA to LOA.  APGAR:1 min-8 , 5 min-9   Nuchal Cord: Yes x 1, reduced on perineum Cord double clamped after cessation of pulsation, cut by friend of patient.  Collection of cord blood for typing completed. Cord blood donation-None  Arterial cord blood sample-No    Placenta delivered-Spontaneous  with 3 vessels . Uterotonics: Pitocin Placenta to L&D. Uterine tone firm bleeding stable  2nd degree  laceration identified.  Episiotomy:None  Local analgesia: N/A  Repair: 2-0 Vicryl in usual fashion with excellent hemostasis Est. Blood Loss (mL):200.00   Complications: None  Mom to postpartum.  Ulyess Blossom to Couplet care / Skin to Skin.  Delivery Report:  Review the Delivery Report for details.    Signed: Clancy Gourd, MSN 10/06/2019, 9:07 AM

## 2019-02-26 DIAGNOSIS — O200 Threatened abortion: Secondary | ICD-10-CM | POA: Diagnosis not present

## 2019-03-12 DIAGNOSIS — Z113 Encounter for screening for infections with a predominantly sexual mode of transmission: Secondary | ICD-10-CM | POA: Diagnosis not present

## 2019-03-12 DIAGNOSIS — O3680X0 Pregnancy with inconclusive fetal viability, not applicable or unspecified: Secondary | ICD-10-CM | POA: Diagnosis not present

## 2019-03-20 DIAGNOSIS — R197 Diarrhea, unspecified: Secondary | ICD-10-CM | POA: Diagnosis not present

## 2019-03-21 DIAGNOSIS — R197 Diarrhea, unspecified: Secondary | ICD-10-CM | POA: Diagnosis not present

## 2019-03-31 DIAGNOSIS — R197 Diarrhea, unspecified: Secondary | ICD-10-CM | POA: Diagnosis not present

## 2019-03-31 DIAGNOSIS — Z3A10 10 weeks gestation of pregnancy: Secondary | ICD-10-CM | POA: Diagnosis not present

## 2019-04-09 DIAGNOSIS — L501 Idiopathic urticaria: Secondary | ICD-10-CM | POA: Diagnosis not present

## 2019-04-09 DIAGNOSIS — J310 Chronic rhinitis: Secondary | ICD-10-CM | POA: Diagnosis not present

## 2019-04-09 DIAGNOSIS — Z91018 Allergy to other foods: Secondary | ICD-10-CM | POA: Diagnosis not present

## 2019-04-09 DIAGNOSIS — J328 Other chronic sinusitis: Secondary | ICD-10-CM | POA: Diagnosis not present

## 2019-04-09 NOTE — Progress Notes (Signed)
Formatting of this note might be different from the original.  Pt reports that she has been seen by GI specialist and has been having issues with constipation. Prior to this she had diarrhea x 3 weeks. States that she has also been seen by allergist and has recent allergy to crawfish. States has been taking colace 100 mg BID with some relief but reports that she has minimal hard stools and has not had one since Saturday. Discussed other recommended medications in pregnancy for constipation.    Reports occasional nausea and will take zofran PRN.     Reports GERD as well. Educated to try pepcid 20 mg BID PRN. Discussed GERD relief.     Educated on recommendations of vaccine in pregnancy. Discussed third trimester growth scan.     Pt reports that she has been having some "thoughts". States that she is having some thoughts of abortion because this was not a planned pregnancy. States that has same FOB as other children but timing of this pregnancy is not ideal. Reports has scheduled visit with therapist next Tuesday PM to discuss follow up.   Electronically signed by Maeola Harman, RN, BSN at 04/09/2019  1:26 PM EST

## 2019-04-09 NOTE — Progress Notes (Signed)
Formatting of this note might be different from the original.  Pt has questions about the vaccine.  She c/o GERD, states that prilosec is not working.  She c/o constipation and wants to know what she can take for that.  Electronically signed by Dorrene German, CMA at 04/12/2019  8:28 AM EST

## 2019-04-12 NOTE — Progress Notes (Signed)
Formatting of this note might be different from the original.  RETURN OB VISIT SUMMARY    29 y.o. [id number] at [redacted]w[redacted]d     SUBJECTIVE:  The patient has no unusual complaints.    OBJECTIVE:  Physical Exam:  SEE PRENATAL FLOWSHEET    ASSESSMENT:  Normal pregnancy    PLAN:  Routine prenatal care  NOB labs today  Pepcid for GERD      Electronically signed by Ed Blalock, MD at 04/12/2019  8:28 AM EST

## 2019-05-07 DIAGNOSIS — Z3A15 15 weeks gestation of pregnancy: Secondary | ICD-10-CM | POA: Diagnosis not present

## 2019-05-07 DIAGNOSIS — Z361 Encounter for antenatal screening for raised alphafetoprotein level: Secondary | ICD-10-CM | POA: Diagnosis not present

## 2019-05-07 DIAGNOSIS — O09292 Supervision of pregnancy with other poor reproductive or obstetric history, second trimester: Secondary | ICD-10-CM | POA: Diagnosis not present

## 2019-05-15 DIAGNOSIS — O26892 Other specified pregnancy related conditions, second trimester: Secondary | ICD-10-CM | POA: Diagnosis not present

## 2019-05-15 DIAGNOSIS — N949 Unspecified condition associated with female genital organs and menstrual cycle: Secondary | ICD-10-CM | POA: Diagnosis not present

## 2019-05-15 DIAGNOSIS — B373 Candidiasis of vulva and vagina: Secondary | ICD-10-CM | POA: Diagnosis not present

## 2019-06-02 DIAGNOSIS — Z3A19 19 weeks gestation of pregnancy: Secondary | ICD-10-CM | POA: Diagnosis not present

## 2019-06-02 DIAGNOSIS — Z363 Encounter for antenatal screening for malformations: Secondary | ICD-10-CM | POA: Diagnosis not present

## 2019-06-02 DIAGNOSIS — O09292 Supervision of pregnancy with other poor reproductive or obstetric history, second trimester: Secondary | ICD-10-CM | POA: Diagnosis not present

## 2019-06-09 ENCOUNTER — Emergency Department (INDEPENDENT_AMBULATORY_CARE_PROVIDER_SITE_OTHER)
Admission: EM | Admit: 2019-06-09 | Discharge: 2019-06-09 | Disposition: A | Discharge status: Home / Self Care | Payer: BC Managed Care – PPO | Source: Home / Self Care | Attending: Family Medicine | Admitting: Family Medicine

## 2019-06-09 ENCOUNTER — Other Ambulatory Visit: Payer: Self-pay

## 2019-06-09 ENCOUNTER — Encounter: Payer: Self-pay | Admitting: Emergency Medicine

## 2019-06-09 DIAGNOSIS — J069 Acute upper respiratory infection, unspecified: Secondary | ICD-10-CM

## 2019-06-09 DIAGNOSIS — J302 Other seasonal allergic rhinitis: Secondary | ICD-10-CM

## 2019-06-09 MED ORDER — AMOXICILLIN 875 MG PO TABS: 875.0000 mg | ORAL_TABLET | Freq: Two times a day (BID) | ORAL | 0 refills | Status: DC

## 2019-06-09 NOTE — ED Provider Notes (Signed)
[facility]    CSN: [id number] Arrival date & time: 06/09/19  4166      History   Chief Complaint Chief Complaint  Patient presents with  . Sinus Problem    HPI Melissa Alexander is a 29 y.o. female.   Patient has seasonal allergic rhinitis, and five days ago she began to have increased congestion not responding to her Zyrtec.  The next day she developed a sore throat.  She has become fatigued and now has an occasional cough.  She denies fevers, chills, and sweats.  She is now [redacted] weeks pregnant. She had COVID19 in December 2020.  She has a past history of sinus surgery and still has an occasional sinusitis.  The history is provided by the patient.    Past Medical History:  Diagnosis Date  . Allergy   . Depression    postpartum  . PCOS (polycystic ovarian syndrome)     Patient Active Problem List   Diagnosis Date Noted  . Moderate episode of recurrent major depressive disorder (HCC) 01/28/2019  . GAD (generalized anxiety disorder) 01/28/2019  . Elevated fasting glucose 01/28/2019  . Overweight (BMI 25.0-29.9) 09/24/2018  . PCOS (polycystic ovarian syndrome) 09/24/2018  . Post partum depression 09/24/2018  . Tinnitus of right ear 12/24/2016  . Sinus pressure 12/24/2016  . Pulsatile tinnitus of right ear 11/30/2016  . Elevated serum creatinine 11/30/2016  . AKI (acute kidney injury) (HCC) 11/01/2016  . Polyphagia 02/25/2016  . Leukocytosis 02/25/2016  . Rhinitis, allergic 06/29/2015  . Seasonal allergies 06/29/2015  . Other migraine with status migrainosus, intractable 06/04/2014  . Hemorrhoid 04/08/2014    History reviewed. No pertinent surgical history.  OB History    Gravida  2   Para      Term      Preterm      AB      Living        SAB      TAB      Ectopic      Multiple      Live Births               Home Medications    Prior to Admission medications   Medication Sig Start Date End Date Taking? Authorizing Provider    cetirizine (ZYRTEC) 10 MG tablet Take 10 mg by mouth daily.   Yes [provider]  Prenatal Vit-Fe Fumarate-FA (MULTIVITAMIN-PRENATAL) 27-0.8 MG TABS tablet Take 1 tablet by mouth daily at 12 noon.   Yes [provider]  amoxicillin (AMOXIL) 875 MG tablet Take 1 tablet (875 mg total) by mouth 2 (two) times daily. (Rx void after 06/17/19) 06/09/19   Lattie Haw, MD  metFORMIN (GLUCOPHAGE-XR) 500 MG 24 hr tablet Take 1 tablet by mouth 2 (two) times daily. 09/12/17   [provider]  escitalopram (LEXAPRO) 20 MG tablet Take 20 mg by mouth daily.  03/06/14  [provider]    Family History Family History  Problem Relation Age of Onset  . Diabetes Mother   . Hypertension Mother   . Aneurysm Mother   . Hyperlipidemia Mother   . Cancer Father        skin  . Heart failure Father   . Hyperlipidemia Father   . Hypertension Father   . Cancer Paternal Aunt   . Stroke Maternal Grandmother   . Stroke Paternal Grandfather     Social History Social History   Tobacco Use  . Smoking status: Never Smoker  .  Smokeless tobacco: Never Used  Substance Use Topics  . Alcohol use: No  . Drug use: No     Allergies   Patient has no known allergies.   Review of Systems Review of Systems + sore throat ? cough No pleuritic pain No wheezing + nasal congestion + post-nasal drainage + sinus pain/pressure No itchy/red eyes No earache No hemoptysis No SOB No fever/chills No nausea No vomiting No abdominal pain No diarrhea No urinary symptoms No skin rash + fatigue No myalgias + headache Used OTC meds (Zyrtec) without relief   Physical Exam Triage Vital Signs ED Triage Vitals  Enc Vitals Group     BP 06/09/19 0837 123/76     Pulse Rate 06/09/19 0837 100     Resp --      Temp 06/09/19 0837 98.8 F (37.1 C)     Temp Source 06/09/19 0837 Oral     SpO2 06/09/19 0837 96 %     Weight 06/09/19 0838 184 lb (83.5 kg)     Height 06/09/19 0838 5'  4" (1.626 m)     Head Circumference --      Peak Flow --      Pain Score 06/09/19 0837 8     Pain Loc --      Pain Edu? --      Excl. in GC? --    No data found.  Updated Vital Signs BP 123/76 (BP Location: Right Arm)   Pulse 100   Temp 98.8 F (37.1 C) (Oral)   Ht 5\' 4"  (1.626 m)   Wt 83.5 kg   LMP 01/09/2019 (Exact Date)   SpO2 96%   BMI 31.58 kg/m   Visual Acuity Right Eye Distance:   Left Eye Distance:   Bilateral Distance:    Right Eye Near:   Left Eye Near:    Bilateral Near:     Physical Exam Nursing notes and Vital Signs reviewed. Appearance:  Patient appears stated age, and in no acute distress Eyes:  Pupils are equal, round, and reactive to light and accomodation.  Extraocular movement is intact.  Conjunctivae are not inflamed  Ears:  Canals normal.  Tympanic membranes normal.  Nose:  Congested turbinates.  No sinus tenderness.  Pharynx:  Normal Neck:  Supple.  Mildly enlarged lateral nodes are present, tender to palpation on the left.   Lungs:  Clear to auscultation.  Breath sounds are equal.  Moving air well. Heart:  Regular rate and rhythm without murmurs, rubs, or gallops.  Abdomen:  Nontender without masses or hepatosplenomegaly.  Bowel sounds are present.  No CVA or flank tenderness.  Extremities:  No edema.  Skin:  No rash present.   UC Treatments / Results  Labs (all labs ordered are listed, but only abnormal results are displayed) Labs Reviewed - No data to display  EKG   Radiology No results found.  Procedures Procedures (including critical care time)  Medications Ordered in UC Medications - No data to display  Initial Impression / Assessment and Plan / UC Course  I have reviewed the triage vital signs and the nursing notes.  Pertinent labs & imaging results that were available during my care of the patient were reviewed by me and considered in my medical decision making (see chart for details).    There is no evidence of  bacterial infection today.  Treat symptomatically for now  Because of her past history of recurring sinusitis, will give an Rx for Amoxicillin to hold. Followup  with Family Doctor if not improved in 10 days.   Final Clinical Impressions(s) / UC Diagnoses   Final diagnoses:  Viral URI with cough  Seasonal allergic rhinitis, unspecified trigger     Discharge Instructions     Take plain guaifenesin (1200mg  extended release tabs such as Mucinex) twice daily, with plenty of water, for cough and congestion.  Get adequate rest.   Also recommend using saline nasal spray several times daily and saline nasal irrigation (AYR is a common brand).    Try warm salt water gargles for sore throat.  May take Delsym Cough Suppressant at bedtime for nighttime cough.  Begin Amoxicillin if not improving about one week or if persistent fever develops  Follow-up with family doctor if not improving about10 days.     ED Prescriptions    Medication Sig Dispense Auth. Provider   amoxicillin (AMOXIL) 875 MG tablet Take 1 tablet (875 mg total) by mouth 2 (two) times daily. (Rx void after 06/17/19) 14 tablet Kandra Nicolas, MD        Kandra Nicolas, MD 06/09/19 [id number]

## 2019-06-09 NOTE — ED Triage Notes (Signed)
Sinus pressure, congestion, pain, sore throat x 4 days. [redacted] weeks pregnant

## 2019-06-09 NOTE — Discharge Instructions (Addendum)
Take plain guaifenesin (1200mg  extended release tabs such as Mucinex) twice daily, with plenty of water, for cough and congestion.  Get adequate rest.   Also recommend using saline nasal spray several times daily and saline nasal irrigation (AYR is a common brand).    Try warm salt water gargles for sore throat.  May take Delsym Cough Suppressant at bedtime for nighttime cough.  Begin Amoxicillin if not improving about one week or if persistent fever develops  Follow-up with family doctor if not improving about10 days.

## 2019-07-02 DIAGNOSIS — Z3A23 23 weeks gestation of pregnancy: Secondary | ICD-10-CM | POA: Diagnosis not present

## 2019-07-02 DIAGNOSIS — Z363 Encounter for antenatal screening for malformations: Secondary | ICD-10-CM | POA: Diagnosis not present

## 2019-07-02 DIAGNOSIS — F5089 Other specified eating disorder: Secondary | ICD-10-CM | POA: Diagnosis not present

## 2019-07-02 DIAGNOSIS — O09292 Supervision of pregnancy with other poor reproductive or obstetric history, second trimester: Secondary | ICD-10-CM | POA: Diagnosis not present

## 2019-07-24 ENCOUNTER — Other Ambulatory Visit: Payer: Self-pay

## 2019-07-24 ENCOUNTER — Inpatient Hospital Stay (HOSPITAL_BASED_OUTPATIENT_CLINIC_OR_DEPARTMENT_OTHER): Payer: 59

## 2019-07-24 ENCOUNTER — Encounter (HOSPITAL_COMMUNITY): Payer: Self-pay | Admitting: Obstetrics & Gynecology

## 2019-07-24 ENCOUNTER — Inpatient Hospital Stay (HOSPITAL_COMMUNITY)
Admission: AD | Admit: 2019-07-24 | Discharge: 2019-07-24 | Disposition: A | Discharge status: Home / Self Care | Payer: 59 | Attending: Obstetrics & Gynecology | Admitting: Obstetrics & Gynecology

## 2019-07-24 DIAGNOSIS — M545 Low back pain, unspecified: Secondary | ICD-10-CM

## 2019-07-24 DIAGNOSIS — Z7984 Long term (current) use of oral hypoglycemic drugs: Secondary | ICD-10-CM | POA: Diagnosis not present

## 2019-07-24 DIAGNOSIS — Z3A27 27 weeks gestation of pregnancy: Secondary | ICD-10-CM | POA: Diagnosis not present

## 2019-07-24 DIAGNOSIS — Z8349 Family history of other endocrine, nutritional and metabolic diseases: Secondary | ICD-10-CM | POA: Insufficient documentation

## 2019-07-24 DIAGNOSIS — O26899 Other specified pregnancy related conditions, unspecified trimester: Secondary | ICD-10-CM

## 2019-07-24 DIAGNOSIS — O26892 Other specified pregnancy related conditions, second trimester: Secondary | ICD-10-CM

## 2019-07-24 DIAGNOSIS — E282 Polycystic ovarian syndrome: Secondary | ICD-10-CM | POA: Insufficient documentation

## 2019-07-24 DIAGNOSIS — O99282 Endocrine, nutritional and metabolic diseases complicating pregnancy, second trimester: Secondary | ICD-10-CM | POA: Insufficient documentation

## 2019-07-24 DIAGNOSIS — Z79899 Other long term (current) drug therapy: Secondary | ICD-10-CM | POA: Insufficient documentation

## 2019-07-24 DIAGNOSIS — O321XX0 Maternal care for breech presentation, not applicable or unspecified: Secondary | ICD-10-CM

## 2019-07-24 LAB — URINALYSIS, ROUTINE W REFLEX MICROSCOPIC
Bilirubin Urine: NEGATIVE
Glucose, UA: 50 mg/dL — AB
Hgb urine dipstick: NEGATIVE
Ketones, ur: 5 mg/dL — AB
Leukocytes,Ua: NEGATIVE
Nitrite: NEGATIVE
Protein, ur: NEGATIVE mg/dL
Specific Gravity, Urine: 1.003 — ABNORMAL LOW (ref 1.005–1.030)
pH: 6 (ref 5.0–8.0)

## 2019-07-24 LAB — WET PREP, GENITAL
Clue Cells Wet Prep HPF POC: NONE SEEN
Sperm: NONE SEEN
Trich, Wet Prep: NONE SEEN
Yeast Wet Prep HPF POC: NONE SEEN

## 2019-07-24 MED ORDER — CYCLOBENZAPRINE HCL 5 MG PO TABS
10.0000 mg | ORAL_TABLET | Freq: Once | ORAL | Status: AC
  Administered 2019-07-24: 10 mg via ORAL
  Filled 2019-07-24: qty 2

## 2019-07-24 MED ORDER — ACETAMINOPHEN 500 MG PO TABS
1000.0000 mg | ORAL_TABLET | Freq: Once | ORAL | Status: AC
  Administered 2019-07-24: 1000 mg via ORAL
  Filled 2019-07-24: qty 2

## 2019-07-24 MED ORDER — LACTATED RINGERS IV BOLUS
1000.0000 mL | Freq: Once | INTRAVENOUS | Status: AC
  Administered 2019-07-24: 1000 mL via INTRAVENOUS

## 2019-07-24 NOTE — Discharge Instructions (Signed)

## 2019-07-24 NOTE — MAU Note (Signed)
Pt c/o back pain that wraps to the front. Feeling cntx for 2 hours. Rating pain 7/10. Has cramping but no bleeding. Denies LOF. +FM.  Has yellowish vag discharge. FHR 154

## 2019-07-24 NOTE — MAU Provider Note (Signed)
History     CSN: [id number]  Arrival date and time: 07/24/19 1530  First Provider Initiated Contact with Patient 07/24/19 1604      Chief Complaint  Patient presents with   Contractions   HPI Melissa Alexander is a 29 y.o. [id number] at [redacted]w[redacted]d who presents to MAU with chief complaint of low back pain. Her pain score is 7/10. The locus of her pain is her tail bone. Her pain radiates down towards her perineum and wraps bilaterally around her lower abdomen. Her pain is aggravated by movement, improves with rest. She has not taken medication or tried other treatments for this complaint.   She denies contraction pain, vaginal bleeding, dysuria, leaking of fluid, decreased fetal movement, fever, falls, or recent illness. She denies physically strenuous activity. Most recent intercourse Monday night 07/21/2019.  She receives care with [facility] and her next appointment is 08/04/2019.   OB History    Gravida  4   Para  2   Term  2   Preterm      AB  1   Living  2     SAB  1   TAB      Ectopic      Multiple      Live Births  2           Past Medical History:  Diagnosis Date   Allergy    Depression    postpartum   PCOS (polycystic ovarian syndrome)     Past Surgical History:  Procedure Laterality Date   NASAL SINUS SURGERY      Family History  Problem Relation Age of Onset   Diabetes Mother    Hypertension Mother    Aneurysm Mother    Hyperlipidemia Mother    Cancer Father        skin   Heart failure Father    Hyperlipidemia Father    Hypertension Father    Cancer Paternal Aunt    Stroke Maternal Grandmother    Stroke Paternal Grandfather     Social History   Tobacco Use   Smoking status: Never Smoker   Smokeless tobacco: Never Used  Substance Use Topics   Alcohol use: No   Drug use: No    Allergies: No Known Allergies  Medications Prior to Admission  Medication Sig Dispense Refill Last Dose   metFORMIN (GLUCOPHAGE-XR)  500 MG 24 hr tablet Take 1 tablet by mouth 2 (two) times daily.   07/24/2019 at Unknown time   omeprazole (PRILOSEC) 20 MG capsule Take 20 mg by mouth daily.   07/23/2019 at Unknown time   Prenatal Vit-Fe Fumarate-FA (MULTIVITAMIN-PRENATAL) 27-0.8 MG TABS tablet Take 1 tablet by mouth daily at 12 noon.   07/23/2019 at Unknown time   amoxicillin (AMOXIL) 875 MG tablet Take 1 tablet (875 mg total) by mouth 2 (two) times daily. (Rx void after 06/17/19) 14 tablet 0    cetirizine (ZYRTEC) 10 MG tablet Take 10 mg by mouth daily.       Review of Systems  Gastrointestinal: Positive for abdominal pain.  Genitourinary: Positive for pelvic pain.  Musculoskeletal: Positive for back pain.  All other systems reviewed and are negative.  Physical Exam   Blood pressure 136/85, pulse 96, temperature 98.9 F (37.2 C), temperature source Oral, resp. rate 18, height 5\' 4"  (1.626 m), weight 88 kg, last menstrual period 01/09/2019, SpO2 100 %, not currently breastfeeding.  Physical Exam  Nursing note and vitals reviewed. Constitutional: She is oriented to person,  place, and time. She appears well-developed and well-nourished.  Cardiovascular: Normal rate and normal heart sounds.  Respiratory: Effort normal and breath sounds normal.  GI: Soft. She exhibits no distension. There is no abdominal tenderness. There is no rebound and no guarding.  Gravid  Genitourinary:    Vagina and uterus normal.     No vaginal discharge.     Genitourinary Comments: Scant staining of blood on swab collection.   Neurological: She is alert and oriented to person, place, and time.  Skin: Skin is warm and dry.  Psychiatric: She has a normal mood and affect. Her behavior is normal. Judgment and thought content normal.    MAU Course/MDM  Procedures  --Cervix visually closed on initial speculum exam. Confirmed with digital exam --Remains closed s/p 3 hours monitoring in MAU --No concerning findings on OB MFM Limited US --Reactive  tracing: baseline 140, mod var, + 10 x 10 accels, no decels --Toco Quiet throughout evaluation --Patient up to bathroom at 1827 with wireless monitors  Orders Placed This Encounter  Procedures   Wet prep, genital   [place] MFM OB Limited   Urinalysis, Routine w reflex microscopic   Insert peripheral IV   Meds ordered this encounter  Medications   acetaminophen (TYLENOL) tablet 1,000 mg   cyclobenzaprine (FLEXERIL) tablet 10 mg   lactated ringers bolus 1,000 mL   Patient Vitals for the past 24 hrs:  BP Temp Temp src Pulse Resp SpO2 Height Weight  07/24/19 1845 106/60 -- -- -- -- -- -- --  07/24/19 1546 136/85 98.9 F (37.2 C) Oral 96 18 100 % 5\' 4"  (1.626 m) 88 kg   Results for orders placed or performed during the hospital encounter of 07/24/19 (from the past 24 hour(s))  Urinalysis, Routine w reflex microscopic     Status: Abnormal   Collection Time: 07/24/19  3:45 PM  Result Value Ref Range   Color, Urine STRAW (A) YELLOW   APPearance CLEAR CLEAR   Specific Gravity, Urine 1.003 (L) 1.005 - 1.030   pH 6.0 5.0 - 8.0   Glucose, UA 50 (A) NEGATIVE mg/dL   Hgb urine dipstick NEGATIVE NEGATIVE   Bilirubin Urine NEGATIVE NEGATIVE   Ketones, ur 5 (A) NEGATIVE mg/dL   Protein, ur NEGATIVE NEGATIVE mg/dL   Nitrite NEGATIVE NEGATIVE   Leukocytes,Ua NEGATIVE NEGATIVE  Wet prep, genital     Status: Abnormal   Collection Time: 07/24/19  4:11 PM   Specimen: Vaginal  Result Value Ref Range   Yeast Wet Prep HPF POC NONE SEEN NONE SEEN   Trich, Wet Prep NONE SEEN NONE SEEN   Clue Cells Wet Prep HPF POC NONE SEEN NONE SEEN   WBC, Wet Prep HPF POC MANY (A) NONE SEEN   Sperm NONE SEEN    Assessment and Plan  --29 y.o. 37 at [redacted]w[redacted]d  --S/p 3 hours continuous monitoring --Tailbone pain, no contractions observed --Closed cervix --Very small blood staining on swab collection, likely contact bleeding --No concerning findings on [redacted]w[redacted]d OB Limited --Discharge home in stable  condition  F/U: --[facility] 08/04/2019  08/06/2019, CNM 07/24/2019, 6:59 PM

## 2019-07-25 LAB — GC/CHLAMYDIA PROBE AMP (~~LOC~~) NOT AT ARMC
Chlamydia: NEGATIVE
Comment: NEGATIVE
Comment: NORMAL
Neisseria Gonorrhea: NEGATIVE

## 2019-08-11 LAB — OB RESULTS CONSOLE RPR: RPR: NONREACTIVE

## 2019-09-25 LAB — OB RESULTS CONSOLE GBS: GBS: NEGATIVE

## 2019-09-27 ENCOUNTER — Encounter (HOSPITAL_COMMUNITY): Payer: Self-pay | Admitting: Obstetrics and Gynecology

## 2019-09-27 ENCOUNTER — Inpatient Hospital Stay (HOSPITAL_COMMUNITY)
Admission: AD | Admit: 2019-09-27 | Discharge: 2019-09-28 | Disposition: A | Discharge status: Home / Self Care | Payer: 59 | Attending: Obstetrics and Gynecology | Admitting: Obstetrics and Gynecology

## 2019-09-27 ENCOUNTER — Other Ambulatory Visit: Payer: Self-pay

## 2019-09-27 DIAGNOSIS — Z7984 Long term (current) use of oral hypoglycemic drugs: Secondary | ICD-10-CM | POA: Insufficient documentation

## 2019-09-27 DIAGNOSIS — Z79899 Other long term (current) drug therapy: Secondary | ICD-10-CM | POA: Insufficient documentation

## 2019-09-27 DIAGNOSIS — E282 Polycystic ovarian syndrome: Secondary | ICD-10-CM | POA: Insufficient documentation

## 2019-09-27 DIAGNOSIS — Z3A36 36 weeks gestation of pregnancy: Secondary | ICD-10-CM | POA: Insufficient documentation

## 2019-09-27 DIAGNOSIS — O99283 Endocrine, nutritional and metabolic diseases complicating pregnancy, third trimester: Secondary | ICD-10-CM | POA: Insufficient documentation

## 2019-09-27 DIAGNOSIS — O4703 False labor before 37 completed weeks of gestation, third trimester: Secondary | ICD-10-CM | POA: Insufficient documentation

## 2019-09-28 DIAGNOSIS — O4703 False labor before 37 completed weeks of gestation, third trimester: Secondary | ICD-10-CM | POA: Diagnosis not present

## 2019-09-28 DIAGNOSIS — O99283 Endocrine, nutritional and metabolic diseases complicating pregnancy, third trimester: Secondary | ICD-10-CM | POA: Diagnosis not present

## 2019-09-28 DIAGNOSIS — Z79899 Other long term (current) drug therapy: Secondary | ICD-10-CM | POA: Diagnosis not present

## 2019-09-28 DIAGNOSIS — E282 Polycystic ovarian syndrome: Secondary | ICD-10-CM | POA: Diagnosis not present

## 2019-09-28 DIAGNOSIS — Z7984 Long term (current) use of oral hypoglycemic drugs: Secondary | ICD-10-CM | POA: Diagnosis not present

## 2019-09-28 DIAGNOSIS — Z3A36 36 weeks gestation of pregnancy: Secondary | ICD-10-CM | POA: Diagnosis not present

## 2019-09-28 NOTE — Discharge Instructions (Signed)
Braxton Hicks Contractions °Contractions of the uterus can occur throughout pregnancy, but they are not always a sign that you are in labor. You may have practice contractions called Braxton Hicks contractions. These false labor contractions are sometimes confused with true labor. °What are Braxton Hicks contractions? °Braxton Hicks contractions are tightening movements that occur in the muscles of the uterus before labor. Unlike true labor contractions, these contractions do not result in opening (dilation) and thinning of the cervix. Toward the end of pregnancy (32-34 weeks), Braxton Hicks contractions can happen more often and may become stronger. These contractions are sometimes difficult to tell apart from true labor because they can be very uncomfortable. You should not feel embarrassed if you go to the hospital with false labor. °Sometimes, the only way to tell if you are in true labor is for your health care provider to look for changes in the cervix. The health care provider will do a physical exam and may monitor your contractions. If you are not in true labor, the exam should show that your cervix is not dilating and your water has not broken. °If there are no other health problems associated with your pregnancy, it is completely safe for you to be sent home with false labor. You may continue to have Braxton Hicks contractions until you go into true labor. °How to tell the difference between true labor and false labor °True labor °· Contractions last 30-70 seconds. °· Contractions become very regular. °· Discomfort is usually felt in the top of the uterus, and it spreads to the lower abdomen and low back. °· Contractions do not go away with walking. °· Contractions usually become more intense and increase in frequency. °· The cervix dilates and gets thinner. °False labor °· Contractions are usually shorter and not as strong as true labor contractions. °· Contractions are usually irregular. °· Contractions  are often felt in the front of the lower abdomen and in the groin. °· Contractions may go away when you walk around or change positions while lying down. °· Contractions get weaker and are shorter-lasting as time goes on. °· The cervix usually does not dilate or become thin. °Follow these instructions at home: ° °· Take over-the-counter and prescription medicines only as told by your health care provider. °· Keep up with your usual exercises and follow other instructions from your health care provider. °· Eat and drink lightly if you think you are going into labor. °· If Braxton Hicks contractions are making you uncomfortable: °? Change your position from lying down or resting to walking, or change from walking to resting. °? Sit and rest in a tub of warm water. °? Drink enough fluid to keep your urine pale yellow. Dehydration may cause these contractions. °? Do slow and deep breathing several times an hour. °· Keep all follow-up prenatal visits as told by your health care provider. This is important. °Contact a health care provider if: °· You have a fever. °· You have continuous pain in your abdomen. °Get help right away if: °· Your contractions become stronger, more regular, and closer together. °· You have fluid leaking or gushing from your vagina. °· You pass blood-tinged mucus (bloody show). °· You have bleeding from your vagina. °· You have low back pain that you never had before. °· You feel your baby’s head pushing down and causing pelvic pressure. °· Your baby is not moving inside you as much as it used to. °Summary °· Contractions that occur before labor are   called Braxton Hicks contractions, false labor, or practice contractions. °· Braxton Hicks contractions are usually shorter, weaker, farther apart, and less regular than true labor contractions. True labor contractions usually become progressively stronger and regular, and they become more frequent. °· Manage discomfort from Braxton Hicks contractions  by changing position, resting in a warm bath, drinking plenty of water, or practicing deep breathing. °This information is not intended to replace advice given to you by your health care provider. Make sure you discuss any questions you have with your health care provider. °Document Revised: 01/19/2017 Document Reviewed: 06/22/2016 °Elsevier Patient Education © 2020 [employer] ° °

## 2019-09-28 NOTE — MAU Provider Note (Signed)
History    Chief Complaint  Patient presents with  . Contractions  Melissa Alexander is a 29 y.o. G4P2012 at 36.3 weeks that presents to the MAU for contractions. Pt states she has been contracting on and off for a week and tonight they became closer together and more painful. Denies leaking of fluid or vaginal bleeding. Endorses + fetal movement.   Past Medical History:  Diagnosis Date  . Allergy   . Depression    postpartum  . PCOS (polycystic ovarian syndrome)    Past Surgical History:  Procedure Laterality Date  . NASAL SINUS SURGERY     Family History  Problem Relation Age of Onset  . Diabetes Mother   . Hypertension Mother   . Aneurysm Mother   . Hyperlipidemia Mother   . Cancer Father        skin  . Heart failure Father   . Hyperlipidemia Father   . Hypertension Father   . Cancer Paternal Aunt   . Stroke Maternal Grandmother   . Stroke Paternal Grandfather    Social History   Tobacco Use  . Smoking status: Never Smoker  . Smokeless tobacco: Never Used  Vaping Use  . Vaping Use: Never used  Substance Use Topics  . Alcohol use: No  . Drug use: No   Allergies: No Known Allergies  Medications Prior to Admission  Medication Sig Dispense Refill Last Dose  . amoxicillin-clavulanate (AUGMENTIN) 875-125 MG tablet Take 1 tablet by mouth 2 (two) times daily.   09/27/2019 at Unknown time  . cetirizine (ZYRTEC) 10 MG tablet Take 10 mg by mouth daily.   09/27/2019 at Unknown time  . metFORMIN (GLUCOPHAGE-XR) 500 MG 24 hr tablet Take 1 tablet by mouth 2 (two) times daily.   09/27/2019 at Unknown time  . omeprazole (PRILOSEC) 20 MG capsule Take 20 mg by mouth daily.   09/27/2019 at Unknown time  . Prenatal Vit-Fe Fumarate-FA (MULTIVITAMIN-PRENATAL) 27-0.8 MG TABS tablet Take 1 tablet by mouth daily at 12 noon.   Past Month at Unknown time   Review of Systems  All other systems reviewed and are negative.  Physical Exam   Physical Exam: Last menstrual period 01/09/2019, not  currently breastfeeding. General: NAD Heart: RRR, no murmurs Lungs: CTA b/l  Abd: Soft, NT, EFW 6lbs Ext: no edema Neuro: DTRs normal  SVE closed/40/-3 posterior, per RN  NST: Baseline 135bpm, moderate variability, + accels, no decels Toco: occasional contractions  ED Course  False labor at 36.3 weeks  1. SVE closed/40/-3 with occasional contractions. 2. Category 1 FHT 3. Reviewed labor precautions and fetal kick counts.  4. Discussed Tylenol and Benadryl for sleep in prodromal labor.  5. Will discharge home in stable condition.   [name], CNM, MSN 09/28/2019, 12:36 AM

## 2019-10-05 ENCOUNTER — Other Ambulatory Visit: Payer: Self-pay

## 2019-10-05 ENCOUNTER — Inpatient Hospital Stay (HOSPITAL_COMMUNITY)
Admission: AD | Admit: 2019-10-05 | Discharge: 2019-10-05 | Disposition: A | Discharge status: Home / Self Care | Payer: 59 | Source: Home / Self Care | Attending: Obstetrics and Gynecology | Admitting: Obstetrics and Gynecology

## 2019-10-05 DIAGNOSIS — Z8349 Family history of other endocrine, nutritional and metabolic diseases: Secondary | ICD-10-CM | POA: Insufficient documentation

## 2019-10-05 DIAGNOSIS — Z79899 Other long term (current) drug therapy: Secondary | ICD-10-CM | POA: Insufficient documentation

## 2019-10-05 DIAGNOSIS — Z3A37 37 weeks gestation of pregnancy: Secondary | ICD-10-CM | POA: Insufficient documentation

## 2019-10-05 DIAGNOSIS — Z7984 Long term (current) use of oral hypoglycemic drugs: Secondary | ICD-10-CM | POA: Insufficient documentation

## 2019-10-05 DIAGNOSIS — O26893 Other specified pregnancy related conditions, third trimester: Secondary | ICD-10-CM | POA: Diagnosis not present

## 2019-10-05 DIAGNOSIS — E282 Polycystic ovarian syndrome: Secondary | ICD-10-CM | POA: Insufficient documentation

## 2019-10-05 DIAGNOSIS — O99283 Endocrine, nutritional and metabolic diseases complicating pregnancy, third trimester: Secondary | ICD-10-CM | POA: Insufficient documentation

## 2019-10-05 DIAGNOSIS — O471 False labor at or after 37 completed weeks of gestation: Secondary | ICD-10-CM | POA: Insufficient documentation

## 2019-10-05 MED ORDER — PROMETHAZINE HCL 25 MG/ML IJ SOLN
12.5000 mg | Freq: Once | INTRAMUSCULAR | Status: AC
  Administered 2019-10-05: 12.5 mg via INTRAMUSCULAR
  Filled 2019-10-05: qty 1

## 2019-10-05 MED ORDER — MORPHINE SULFATE (PF) 4 MG/ML IV SOLN
4.0000 mg | Freq: Once | INTRAVENOUS | Status: AC
  Administered 2019-10-05: 4 mg via INTRAMUSCULAR
  Filled 2019-10-05: qty 1

## 2019-10-05 NOTE — MAU Note (Signed)
Presents c/o contractions q 4 minutes.

## 2019-10-05 NOTE — MAU Provider Note (Addendum)
History    Chief Complaint  Patient presents with  . Contractions   Melissa Alexander is a 29 y/o G4P2012 at [redacted]w[redacted]d that presents to the MAU for contractions every 4 minutes all day. States that they became increasingly painful with pelvic pressure. Denies vaginal bleeding or leaking of fluid. Endorses + fetal movement. GBS is negative.   Past Medical History:  Diagnosis Date  . Allergy   . Depression    postpartum  . PCOS (polycystic ovarian syndrome)    Past Surgical History:  Procedure Laterality Date  . NASAL SINUS SURGERY     Family History  Problem Relation Age of Onset  . Diabetes Mother   . Hypertension Mother   . Aneurysm Mother   . Hyperlipidemia Mother   . Cancer Father        skin  . Heart failure Father   . Hyperlipidemia Father   . Hypertension Father   . Cancer Paternal Aunt   . Stroke Maternal Grandmother   . Stroke Paternal Grandfather    Social History   Tobacco Use  . Smoking status: Never Smoker  . Smokeless tobacco: Never Used  Vaping Use  . Vaping Use: Never used  Substance Use Topics  . Alcohol use: No  . Drug use: No   Allergies: No Known Allergies  Medications Prior to Admission  Medication Sig Dispense Refill Last Dose  . amoxicillin-clavulanate (AUGMENTIN) 875-125 MG tablet Take 1 tablet by mouth 2 (two) times daily.   Past Month at Unknown time  . metFORMIN (GLUCOPHAGE-XR) 500 MG 24 hr tablet Take 1 tablet by mouth 2 (two) times daily.   10/05/2019 at 0800  . omeprazole (PRILOSEC) 20 MG capsule Take 20 mg by mouth daily.   10/04/2019 at 2100  . Prenatal Vit-Fe Fumarate-FA (MULTIVITAMIN-PRENATAL) 27-0.8 MG TABS tablet Take 1 tablet by mouth daily at 12 noon.   10/04/2019 at 2100  . cetirizine (ZYRTEC) 10 MG tablet Take 10 mg by mouth daily.   10/04/2019 at 2100   Review of Systems  Constitutional: Negative for chills and fever.  HENT: Negative for congestion and sore throat.   Respiratory: Negative for cough and wheezing.    Gastrointestinal: Positive for heartburn.  Neurological: Negative for headaches.  Psychiatric/Behavioral: Negative for depression. The patient is not nervous/anxious.    Physical Exam   Physical Exam: Blood pressure 124/71, pulse 99, temperature 98.6 F (37 C), temperature source Oral, resp. rate 18, height 5\' 4"  (1.626 m), weight 91 kg, last menstrual period 01/09/2019, SpO2 100 %, not currently breastfeeding. General: NAD Heart: RRR, no murmurs Lungs: CTA b/l  Abd: Soft, NT, EFW 7 lbs Ext: no edema Neuro: DTRs normal SVE: 3/60/-3 on admission and 1 and 2 hours after walking; some bloody show present. Cervix is posterior  NST: Baseline 140, moderate variability, + accels, no decels Toco: ctxs q 2-4 minutes  Meds ordered this encounter  Medications  . promethazine (PHENERGAN) injection 12.5 mg  . morphine 4 MG/ML injection 4 mg   ED Course  01/11/2019 at [redacted]w[redacted]d, prodromal labor.   1. No cervical change after 3 hours. Contractions every 2-4 minutes with bloody show.  2. Category 1 FHT; reactive NST 3. Reviewed options at this gestation including discharge home, admit for observation until morning, or therapeutic rest. Pt chooses therapeutic rest and discharge home.  4. Morphine and Phenergan given IM.  5. Reviewed labor precautions and when to return to the MAU. Discussed fetal kick counts.  6. Will discharge patient home  in stable condition. Will follow-up in the office this week.   [name], CNM, MSN 10/05/2019, 8:59 PM

## 2019-10-05 NOTE — MAU Note (Signed)
Melissa Alexander is a 29 y.o. at [redacted]w[redacted]d here in MAU reporting: contractions since 0400, they have gotten worse and more frequent. They are about every 4 minutes. No bleeding, no LOF. Unsure about FM due to pain.  Onset of complaint: today  Pain score: 7/10  Vitals:   10/05/19 1841  BP: 123/69  Pulse: 99  Resp: 16  Temp: 98.8 F (37.1 C)  SpO2: 100%     FHT:160  Lab orders placed from triage: none

## 2019-10-06 ENCOUNTER — Inpatient Hospital Stay (HOSPITAL_COMMUNITY): Payer: 59 | Admitting: Anesthesiology

## 2019-10-06 ENCOUNTER — Encounter (HOSPITAL_COMMUNITY): Payer: Self-pay | Admitting: Obstetrics

## 2019-10-06 ENCOUNTER — Inpatient Hospital Stay (HOSPITAL_COMMUNITY)
Admission: AD | Admit: 2019-10-06 | Discharge: 2019-10-07 | DRG: 807 | Disposition: A | Discharge status: Home / Self Care | Payer: 59 | Attending: Obstetrics and Gynecology | Admitting: Obstetrics and Gynecology

## 2019-10-06 ENCOUNTER — Other Ambulatory Visit: Payer: Self-pay

## 2019-10-06 DIAGNOSIS — O26893 Other specified pregnancy related conditions, third trimester: Secondary | ICD-10-CM | POA: Diagnosis present

## 2019-10-06 DIAGNOSIS — O99344 Other mental disorders complicating childbirth: Secondary | ICD-10-CM | POA: Diagnosis present

## 2019-10-06 DIAGNOSIS — E669 Obesity, unspecified: Secondary | ICD-10-CM | POA: Diagnosis present

## 2019-10-06 DIAGNOSIS — F411 Generalized anxiety disorder: Secondary | ICD-10-CM | POA: Diagnosis present

## 2019-10-06 DIAGNOSIS — O99214 Obesity complicating childbirth: Secondary | ICD-10-CM | POA: Diagnosis present

## 2019-10-06 DIAGNOSIS — Z8616 Personal history of COVID-19: Secondary | ICD-10-CM

## 2019-10-06 DIAGNOSIS — Z20822 Contact with and (suspected) exposure to covid-19: Secondary | ICD-10-CM | POA: Diagnosis present

## 2019-10-06 DIAGNOSIS — Z3A37 37 weeks gestation of pregnancy: Secondary | ICD-10-CM | POA: Diagnosis not present

## 2019-10-06 LAB — CBC
HCT: 34.3 % — ABNORMAL LOW (ref 36.0–46.0)
Hemoglobin: 11.4 g/dL — ABNORMAL LOW (ref 12.0–15.0)
MCH: 27.5 pg (ref 26.0–34.0)
MCHC: 33.2 g/dL (ref 30.0–36.0)
MCV: 82.9 fL (ref 80.0–100.0)
Platelets: 179 10*3/uL (ref 150–400)
RBC: 4.14 MIL/uL (ref 3.87–5.11)
RDW: 13.2 % (ref 11.5–15.5)
WBC: 11.3 10*3/uL — ABNORMAL HIGH (ref 4.0–10.5)
nRBC: 0 % (ref 0.0–0.2)

## 2019-10-06 LAB — SARS CORONAVIRUS 2 BY RT PCR (HOSPITAL ORDER, PERFORMED IN ~~LOC~~ HOSPITAL LAB): SARS Coronavirus 2: NEGATIVE

## 2019-10-06 LAB — TYPE AND SCREEN
ABO/RH(D): B POS
Antibody Screen: NEGATIVE

## 2019-10-06 LAB — RPR: RPR Ser Ql: NONREACTIVE

## 2019-10-06 MED ORDER — BUTORPHANOL TARTRATE 1 MG/ML IJ SOLN: 2.0000 mg | Freq: Once | INTRAMUSCULAR | Status: DC

## 2019-10-06 MED ORDER — IBUPROFEN 600 MG PO TABS
600.0000 mg | ORAL_TABLET | Freq: Four times a day (QID) | ORAL | Status: DC
  Administered 2019-10-06 – 2019-10-07 (×4): 600 mg via ORAL
  Filled 2019-10-06 (×5): qty 1

## 2019-10-06 MED ORDER — DIPHENHYDRAMINE HCL 50 MG/ML IJ SOLN: 12.5000 mg | INTRAMUSCULAR | Status: DC | PRN

## 2019-10-06 MED ORDER — EPHEDRINE 5 MG/ML INJ
10.0000 mg | INTRAVENOUS | Status: DC | PRN
  Filled 2019-10-06: qty 2

## 2019-10-06 MED ORDER — ZOLPIDEM TARTRATE 5 MG PO TABS: 5.0000 mg | ORAL_TABLET | Freq: Every evening | ORAL | Status: DC | PRN

## 2019-10-06 MED ORDER — BENZOCAINE-MENTHOL 20-0.5 % EX AERO
1.0000 "application " | INHALATION_SPRAY | CUTANEOUS | Status: DC | PRN
  Administered 2019-10-06: 1 via TOPICAL
  Filled 2019-10-06: qty 56

## 2019-10-06 MED ORDER — LACTATED RINGERS IV SOLN
500.0000 mL | Freq: Once | INTRAVENOUS | Status: AC
  Administered 2019-10-06: 500 mL via INTRAVENOUS

## 2019-10-06 MED ORDER — LIDOCAINE HCL (PF) 1 % IJ SOLN
INTRAMUSCULAR | Status: DC | PRN
  Administered 2019-10-06: 12 mL via EPIDURAL

## 2019-10-06 MED ORDER — SODIUM CHLORIDE (PF) 0.9 % IJ SOLN
INTRAMUSCULAR | Status: DC | PRN
  Administered 2019-10-06: 12 mL/h via EPIDURAL

## 2019-10-06 MED ORDER — TERBUTALINE SULFATE 1 MG/ML IJ SOLN: 0.2500 mg | Freq: Once | INTRAMUSCULAR | Status: DC | PRN

## 2019-10-06 MED ORDER — SIMETHICONE 80 MG PO CHEW: 80.0000 mg | CHEWABLE_TABLET | ORAL | Status: DC | PRN

## 2019-10-06 MED ORDER — FENTANYL-BUPIVACAINE-NACL 0.5-0.125-0.9 MG/250ML-% EP SOLN
12.0000 mL/h | EPIDURAL | Status: DC | PRN
  Filled 2019-10-06: qty 250

## 2019-10-06 MED ORDER — OXYTOCIN-SODIUM CHLORIDE 30-0.9 UT/500ML-% IV SOLN
1.0000 m[IU]/min | INTRAVENOUS | Status: DC
  Administered 2019-10-06: 2 m[IU]/min via INTRAVENOUS
  Filled 2019-10-06: qty 500

## 2019-10-06 MED ORDER — SENNOSIDES-DOCUSATE SODIUM 8.6-50 MG PO TABS
2.0000 | ORAL_TABLET | ORAL | Status: DC
  Administered 2019-10-07: 2 via ORAL
  Filled 2019-10-06: qty 2

## 2019-10-06 MED ORDER — TETANUS-DIPHTH-ACELL PERTUSSIS 5-2.5-18.5 LF-MCG/0.5 IM SUSP: 0.5000 mL | Freq: Once | INTRAMUSCULAR | Status: DC

## 2019-10-06 MED ORDER — WITCH HAZEL-GLYCERIN EX PADS
1.0000 "application " | MEDICATED_PAD | CUTANEOUS | Status: DC | PRN
  Administered 2019-10-06: 1 via TOPICAL

## 2019-10-06 MED ORDER — PHENYLEPHRINE 40 MCG/ML (10ML) SYRINGE FOR IV PUSH (FOR BLOOD PRESSURE SUPPORT)
80.0000 ug | PREFILLED_SYRINGE | INTRAVENOUS | Status: DC | PRN
  Filled 2019-10-06: qty 10

## 2019-10-06 MED ORDER — ONDANSETRON HCL 4 MG/2ML IJ SOLN: 4.0000 mg | Freq: Four times a day (QID) | INTRAMUSCULAR | Status: DC | PRN

## 2019-10-06 MED ORDER — ONDANSETRON HCL 4 MG/2ML IJ SOLN: 4.0000 mg | INTRAMUSCULAR | Status: DC | PRN

## 2019-10-06 MED ORDER — SODIUM CHLORIDE 0.9% FLUSH: 3.0000 mL | INTRAVENOUS | Status: DC | PRN

## 2019-10-06 MED ORDER — ACETAMINOPHEN 325 MG PO TABS
650.0000 mg | ORAL_TABLET | ORAL | Status: DC | PRN
  Administered 2019-10-06 – 2019-10-07 (×4): 650 mg via ORAL
  Filled 2019-10-06 (×4): qty 2

## 2019-10-06 MED ORDER — SODIUM CHLORIDE 0.9% FLUSH: 3.0000 mL | Freq: Two times a day (BID) | INTRAVENOUS | Status: DC

## 2019-10-06 MED ORDER — DIPHENHYDRAMINE HCL 25 MG PO CAPS: 25.0000 mg | ORAL_CAPSULE | Freq: Four times a day (QID) | ORAL | Status: DC | PRN

## 2019-10-06 MED ORDER — PRENATAL MULTIVITAMIN CH: 1.0000 | ORAL_TABLET | Freq: Every day | ORAL | Status: DC

## 2019-10-06 MED ORDER — LACTATED RINGERS IV SOLN: 500.0000 mL | INTRAVENOUS | Status: DC | PRN

## 2019-10-06 MED ORDER — OXYTOCIN 10 UNIT/ML IJ SOLN: 10.0000 [IU] | Freq: Once | INTRAMUSCULAR | Status: DC

## 2019-10-06 MED ORDER — METFORMIN HCL ER 500 MG PO TB24
500.0000 mg | ORAL_TABLET | Freq: Every day | ORAL | Status: DC
  Filled 2019-10-06 (×2): qty 1

## 2019-10-06 MED ORDER — OXYTOCIN BOLUS FROM INFUSION
333.0000 mL | Freq: Once | INTRAVENOUS | Status: AC
  Administered 2019-10-06: 333 mL via INTRAVENOUS

## 2019-10-06 MED ORDER — SODIUM CHLORIDE 0.9 % IV SOLN: 250.0000 mL | INTRAVENOUS | Status: DC | PRN

## 2019-10-06 MED ORDER — DIBUCAINE (PERIANAL) 1 % EX OINT: 1.0000 "application " | TOPICAL_OINTMENT | CUTANEOUS | Status: DC | PRN

## 2019-10-06 MED ORDER — ACETAMINOPHEN 500 MG PO TABS: 1000.0000 mg | ORAL_TABLET | Freq: Four times a day (QID) | ORAL | Status: DC | PRN

## 2019-10-06 MED ORDER — COCONUT OIL OIL: 1.0000 "application " | TOPICAL_OIL | Status: DC | PRN

## 2019-10-06 MED ORDER — LIDOCAINE HCL (PF) 1 % IJ SOLN: 30.0000 mL | INTRAMUSCULAR | Status: DC | PRN

## 2019-10-06 MED ORDER — FENTANYL CITRATE (PF) 100 MCG/2ML IJ SOLN: 50.0000 ug | INTRAMUSCULAR | Status: DC | PRN

## 2019-10-06 MED ORDER — OXYTOCIN-SODIUM CHLORIDE 30-0.9 UT/500ML-% IV SOLN: 2.5000 [IU]/h | INTRAVENOUS | Status: DC

## 2019-10-06 MED ORDER — SOD CITRATE-CITRIC ACID 500-334 MG/5ML PO SOLN: 30.0000 mL | ORAL | Status: DC | PRN

## 2019-10-06 MED ORDER — LACTATED RINGERS IV SOLN: INTRAVENOUS | Status: DC

## 2019-10-06 MED ORDER — ONDANSETRON HCL 4 MG PO TABS: 4.0000 mg | ORAL_TABLET | ORAL | Status: DC | PRN

## 2019-10-06 NOTE — Anesthesia Procedure Notes (Signed)
Associated Order(s): Epidural  Formatting of this note might be different from the original.  Epidural  Patient location during procedure: OB  Start time: 10/06/2019 3:17 AM  End time: 10/06/2019 3:27 AM    Staffing  Anesthesiologist: Elmer Picker, MD  Performed: anesthesiologist     Preanesthetic Checklist  Completed: patient identified, IV checked, risks and benefits discussed, monitors and equipment checked, pre-op evaluation and timeout performed    Epidural  Patient position: sitting  Prep: DuraPrep and site prepped and draped  Patient monitoring: continuous pulse ox, blood pressure, heart rate and cardiac monitor  Approach: midline  Location: L3-L4  Injection technique: LOR air    Needle:   Needle type: Tuohy   Needle gauge: 17 G  Needle length: 9 cm  Needle insertion depth: 5 cm  Catheter type: closed end flexible  Catheter size: 19 Gauge  Catheter at skin depth: 11 cm  Test dose: negative    Assessment  Sensory level: T8  Events: blood not aspirated, injection not painful, no injection resistance, no paresthesia and negative IV test    Additional Notes  Patient identified. Risks/Benefits/Options discussed with patient including but not limited to bleeding, infection, nerve damage, paralysis, failed block, incomplete pain control, headache, blood pressure changes, nausea, vomiting, reactions to medication both or allergic, itching and postpartum back pain. Confirmed with bedside nurse the patient's most recent platelet count. Confirmed with patient that they are not currently taking any anticoagulation, have any bleeding history or any family history of bleeding disorders. Patient expressed understanding and wished to proceed. All questions were answered. Sterile technique was used throughout the entire procedure. Please see nursing notes for vital signs. Test dose was given through epidural catheter and negative prior to continuing to dose epidural or start infusion. Warning signs of high block given to  the patient including shortness of breath, tingling/numbness in hands, complete motor block, or any concerning symptoms with instructions to call for help. Patient was given instructions on fall risk and not to get out of bed. All questions and concerns addressed with instructions to call with any issues or inadequate analgesia.  Reason for block:procedure for pain      Electronically signed by Elmer Picker, MD at 10/06/2019  3:44 AM EDT

## 2019-10-06 NOTE — Anesthesia Pre-Procedure Evaluation (Signed)
Formatting of this note is different from the original.                                    Anesthesia Evaluation   Patient identified by MRN, date of birth, ID band  Patient awake    Reviewed:  Allergy & Precautions, NPO status , Patient's Chart, lab work & pertinent test results    Airway  Mallampati: II    TM Distance: >3 FB  Neck ROM: Full     Dental  no notable dental hx.      Pulmonary  neg pulmonary ROS,     Pulmonary exam normal  breath sounds clear to auscultation     Cardiovascular  negative cardio ROS  Normal cardiovascular exam  Rhythm:Regular Rate:Normal      Neuro/Psych   Headaches, PSYCHIATRIC DISORDERS Anxiety Depression     GI/Hepatic  negative GI ROS, Neg liver ROS,    Endo/Other   negative endocrine ROSObese BMI 34   Renal/GU  negative Renal ROS   negative genitourinary    Musculoskeletal  negative musculoskeletal ROS  (+)    Abdominal    Peds   Hematology  negative hematology ROS  (+)    Anesthesia Other Findings     Reproductive/Obstetrics  (+) Pregnancy              Anesthesia Physical  Anesthesia Plan    ASA: II    Anesthesia Plan: Epidural     Post-op Pain Management:      Induction:     PONV Risk Score and Plan: Treatment may vary due to age or medical condition    Airway Management Planned: Natural Airway    Additional Equipment:     Intra-op Plan:     Post-operative Plan:     Informed Consent: I have reviewed the patients History and Physical, chart, labs and discussed the procedure including the risks, benefits and alternatives for the proposed anesthesia with the patient or authorized representative who has indicated his/her understanding and acceptance.     Plan Discussed with: Anesthesiologist    Anesthesia Plan Comments: (Patient identified. Risks, benefits, options discussed with patient including but not limited to bleeding, infection, nerve damage, paralysis, failed block, incomplete pain control, headache, blood pressure changes, nausea, vomiting, reactions to medication, itching,  and post partum back pain. Confirmed with bedside nurse the patient's most recent platelet count. Confirmed with the patient that they are not taking any anticoagulation, have any bleeding history or any family history of bleeding disorders. Patient expressed understanding and wishes to proceed. All questions were answered. )     Anesthesia Quick Evaluation    Electronically signed by Elmer Picker, MD at 10/06/2019  3:18 AM EDT

## 2019-10-06 NOTE — Anesthesia Post-Procedure Evaluation (Signed)
Formatting of this note is different from the original.   Anesthesia Post Note    Patient: Kathryn Watkins    Procedure(s) Performed: AN AD HOC LABOR EPIDURAL        Patient location during evaluation: Mother Baby  Anesthesia Type: Epidural  Level of consciousness: awake and alert  Pain management: pain level controlled  Vital Signs Assessment: post-procedure vital signs reviewed and stable  Respiratory status: spontaneous breathing, nonlabored ventilation and respiratory function stable  Cardiovascular status: stable  Postop Assessment: no headache, no backache and epidural receding  Anesthetic complications: no    No complications documented.    Last Vitals:   Vitals:    10/06/19 1015 10/06/19 1056   BP: 119/60 106/62   Pulse: 88 85   Resp: 18 18   Temp:  36.9 C   SpO2:  100%     Last Pain:   Vitals:    10/06/19 1056   TempSrc: Oral   PainSc: 0-No pain     Pain Goal:                      MULLINS,JANET      Electronically signed by Renford Dills, CRNA at 10/06/2019 12:04 PM EDT

## 2019-10-06 NOTE — MAU Note (Signed)
..  Melissa Alexander is a 29 y.o. at [redacted]w[redacted]d here in MAU reporting: increase in intensity and strength of CTX. Denies LOF. +FM. Reports some bloody show.   Pain score: 9/10 Vitals:   10/06/19 0115  BP: 113/71  Resp: 17  Temp: 98.6 F (37 C)  SpO2: 100%

## 2019-10-06 NOTE — MAU Note (Signed)
Spoke to Dorisann Frames, CNM and received orders for IV start and stadol.

## 2019-10-06 NOTE — Anesthesia Postprocedure Evaluation (Signed)
Anesthesia Post Note  Patient: Melissa Alexander  Procedure(s) Performed: AN AD HOC LABOR EPIDURAL     Patient location during evaluation: Mother Baby Anesthesia Type: Epidural Level of consciousness: awake and alert Pain management: pain level controlled Vital Signs Assessment: post-procedure vital signs reviewed and stable Respiratory status: spontaneous breathing, nonlabored ventilation and respiratory function stable Cardiovascular status: stable Postop Assessment: no headache, no backache and epidural receding Anesthetic complications: no   No complications documented.  Last Vitals:  Vitals:   10/06/19 1015 10/06/19 1056  BP: 119/60 106/62  Pulse: 88 85  Resp: 18 18  Temp:  36.9 C  SpO2:  100%    Last Pain:  Vitals:   10/06/19 1056  TempSrc: Oral  PainSc: 0-No pain   Pain Goal:                   Yuma Blucher

## 2019-10-06 NOTE — Progress Notes (Signed)
Subjective: Comfortable with epidural. Friend at the bedside providing support. Husband is at home with the stomach flu.   Objective: Vitals:   10/06/19 0356 10/06/19 0400 10/06/19 0401 10/06/19 0431  BP: (!) 112/56  (!) 103/59 105/63  Pulse: (!) 105  (!) 101 97  Resp: 15  16 16   Temp:      TempSrc:      SpO2:  99%    Weight:      Height:        No intake/output data recorded. No intake/output data recorded.  FHT:  FHR: 130 bpm, variability: moderate,  accelerations:  Present,  decelerations:  Absent UC:   irregular, every 4-7 minutes SVE:   Dilation: 4.5 Effacement (%): 80 Station: -2 Exam by:: [email or web address], CNM AROM clear fluid @ 0427  Labs: Recent Labs    10/06/19 0253  WBC 11.3*  HGB 11.4*  HCT 34.3*  PLT 179   Assessment / Plan: 10/08/19 29 y.o. [redacted]w[redacted]d Spontaneous labor, progressing normally  Labor: Progressing normally Preeclampsia:  no signs or symptoms of toxicity Fetal Wellbeing:  Category I Pain Control:  Epidural I/D:  GBS negative Anticipated MOD:  NSVD   After epidural, contractions spaced out to every 4-7 minutes. Will augment with Pitocin 2x2 until adequate contractions.  [redacted]w[redacted]d, CNM, MSN 10/06/2019, 4:48 AM

## 2019-10-06 NOTE — Anesthesia Procedure Notes (Signed)
Epidural Patient location during procedure: OB Start time: 10/06/2019 3:17 AM End time: 10/06/2019 3:27 AM  Staffing Anesthesiologist: Elmer Picker, MD Performed: anesthesiologist   Preanesthetic Checklist Completed: patient identified, IV checked, risks and benefits discussed, monitors and equipment checked, pre-op evaluation and timeout performed  Epidural Patient position: sitting Prep: DuraPrep and site prepped and draped Patient monitoring: continuous pulse ox, blood pressure, heart rate and cardiac monitor Approach: midline Location: L3-L4 Injection technique: LOR air  Needle:  Needle type: Tuohy  Needle gauge: 17 G Needle length: 9 cm Needle insertion depth: 5 cm Catheter type: closed end flexible Catheter size: 19 Gauge Catheter at skin depth: 11 cm Test dose: negative  Assessment Sensory level: T8 Events: blood not aspirated, injection not painful, no injection resistance, no paresthesia and negative IV test  Additional Notes Patient identified. Risks/Benefits/Options discussed with patient including but not limited to bleeding, infection, nerve damage, paralysis, failed block, incomplete pain control, headache, blood pressure changes, nausea, vomiting, reactions to medication both or allergic, itching and postpartum back pain. Confirmed with bedside nurse the patient's most recent platelet count. Confirmed with patient that they are not currently taking any anticoagulation, have any bleeding history or any family history of bleeding disorders. Patient expressed understanding and wished to proceed. All questions were answered. Sterile technique was used throughout the entire procedure. Please see nursing notes for vital signs. Test dose was given through epidural catheter and negative prior to continuing to dose epidural or start infusion. Warning signs of high block given to the patient including shortness of breath, tingling/numbness in hands, complete motor block,  or any concerning symptoms with instructions to call for help. Patient was given instructions on fall risk and not to get out of bed. All questions and concerns addressed with instructions to call with any issues or inadequate analgesia.  Reason for block:procedure for pain

## 2019-10-06 NOTE — H&P (Signed)
OB ADMISSION/ HISTORY & PHYSICAL:  Admission Date: 10/06/2019  1:02 AM  Admit Diagnosis: Normal labor [O80, Z37.9]    Melissa Alexander is a 29 y.o. female presenting for contractions and vaginal bleeding. Pt was seen in the MAU last night for a labor check and was 3/60. Over the course of 3 hours patient did not make cervical change. Pt had some bloody show and was contracting every 2-3 minutes. She received Morphine and Phenergan IM and was discharged home. Pt returned 2 hours later with increased pain with contractions that she rates 9/10 on the pain scale. SVE in MAU was 4/70/-3. States she has continued bright red bleeding when she uses the bathroom. Denies leaking of fluid. Endorses + fetal movement. GBS is negative.   Prenatal History: [id number]   EDC : 10/23/2019 Prenatal care at [facility] since 15 weeks, transfer from [facility]  Prenatal course complicated by: 1. PCOS, was on Metformin at beginning of pregnancy, continued until 28 weeks 2. COVID + during pregnancy 3. Anxiety, stable off meds  Prenatal Labs: ABO, Rh:   B POS Antibody:  Negative Rubella:   Immune RPR:   Non-reactive HBsAg:   Negative HIV:   Negative GBS:   Negative 1 hr Glucola : Declined due to Metformin, after d/c of Metformin the 2 week BS monitoring was WNL Genetic Screening: normal AFP4 Ultrasound: vertex, normal anatomy, XY, anterior placenta 36 week growth U/S: AFI 14.4cm, EFW 3048gm, 6lb 11oz, 74%  Tdap: UTD Flu: Declined    Maternal Diabetes: No Genetic Screening: Normal Maternal Ultrasounds/Referrals: Normal Fetal Ultrasounds or other Referrals:  None Maternal Substance Abuse:  No Significant Maternal Medications:  Meds include: Other: Prilosec, Metformin Significant Maternal Lab Results:  Group B Strep negative Other Comments:  None  Medical / Surgical History : Past medical history:  Past Medical History:  Diagnosis Date  . Allergy   . Depression    postpartum  . PCOS (polycystic ovarian syndrome)      Past surgical history:  Past Surgical History:  Procedure Laterality Date  . NASAL SINUS SURGERY      Family History:  Family History  Problem Relation Age of Onset  . Diabetes Mother   . Hypertension Mother   . Aneurysm Mother   . Hyperlipidemia Mother   . Cancer Father        skin  . Heart failure Father   . Hyperlipidemia Father   . Hypertension Father   . Cancer Paternal Aunt   . Stroke Maternal Grandmother   . Stroke Paternal Grandfather     Social History:  reports that she has never smoked. She has never used smokeless tobacco. She reports that she does not drink alcohol and does not use drugs.  Allergies: Patient has no known allergies.   Current Medications at time of admission:  Medications Prior to Admission  Medication Sig Dispense Refill Last Dose  . cetirizine (ZYRTEC) 10 MG tablet Take 10 mg by mouth daily.     Marland Kitchen omeprazole (PRILOSEC) 20 MG capsule Take 20 mg by mouth daily.     . Prenatal Vit-Fe Fumarate-FA (MULTIVITAMIN-PRENATAL) 27-0.8 MG TABS tablet Take 1 tablet by mouth daily at 12 noon.       Review of Systems: Review of Systems  All other systems reviewed and are negative.  Physical Exam: Vital signs and nursing notes reviewed.  Patient Vitals for the past 24 hrs:  BP Temp Temp src Resp SpO2  10/06/19 0115 113/71 98.6 F (37 C) Oral 17  100 %    General: AAO x 3, NAD, breathing through contractions, requesting epidural Heart: RRR Lungs:CTAB Abdomen: Gravid, NT, Leopold's 7lbs Extremities: no edema Genitalia / VE: Dilation: 4 Effacement (%): 70 Cervical Position: Posterior Station: -3 Presentation: Vertex Exam by:: A. Sedano, RN   FHR: 140BPM, mod variability, + accels, no decels TOCO: Ctx q 4-5 minutes  Labs:   Pending T&S, CBC, RPR  No results for input(s): WBC, HGB, HCT, PLT in the last 72 hours.   Assessment:  29 y.o. [id number] at [redacted]w[redacted]d  1. Early labor with cervical change and bright red bleeding 2. FHR category 1 3. GBS  negative 4. Desires epidural 5. Plans to breastfeed 6. Placenta disposal per patient request  Plan:  1. Admit to [facility] per consult with Dr. Billy Coast 2. Routine L&D orders 3. Epidural when requested 4. Expectant management 5. Anticipate NSVB  Dr. Billy Coast notified of admission/plan of care.   [name] CNM, MSN 10/06/2019, 2:11 AM

## 2019-10-06 NOTE — Anesthesia Preprocedure Evaluation (Signed)
Anesthesia Evaluation  Patient identified by MRN, date of birth, ID band Patient awake    Reviewed: Allergy & Precautions, NPO status , Patient's Chart, lab work & pertinent test results  Airway Mallampati: II  TM Distance: >3 FB Neck ROM: Full    Dental no notable dental hx.    Pulmonary neg pulmonary ROS,    Pulmonary exam normal breath sounds clear to auscultation       Cardiovascular negative cardio ROS Normal cardiovascular exam Rhythm:Regular Rate:Normal     Neuro/Psych  Headaches, PSYCHIATRIC DISORDERS Anxiety Depression    GI/Hepatic negative GI ROS, Neg liver ROS,   Endo/Other  negative endocrine ROSObese BMI 34  Renal/GU negative Renal ROS  negative genitourinary   Musculoskeletal negative musculoskeletal ROS (+)   Abdominal   Peds  Hematology negative hematology ROS (+)   Anesthesia Other Findings   Reproductive/Obstetrics (+) Pregnancy                             Anesthesia Physical Anesthesia Plan  ASA: II  Anesthesia Plan: Epidural   Post-op Pain Management:    Induction:   PONV Risk Score and Plan: Treatment may vary due to age or medical condition  Airway Management Planned: Natural Airway  Additional Equipment:   Intra-op Plan:   Post-operative Plan:   Informed Consent: I have reviewed the patients History and Physical, chart, labs and discussed the procedure including the risks, benefits and alternatives for the proposed anesthesia with the patient or authorized representative who has indicated his/her understanding and acceptance.       Plan Discussed with: Anesthesiologist  Anesthesia Plan Comments: (Patient identified. Risks, benefits, options discussed with patient including but not limited to bleeding, infection, nerve damage, paralysis, failed block, incomplete pain control, headache, blood pressure changes, nausea, vomiting, reactions to  medication, itching, and post partum back pain. Confirmed with bedside nurse the patient's most recent platelet count. Confirmed with the patient that they are not taking any anticoagulation, have any bleeding history or any family history of bleeding disorders. Patient expressed understanding and wishes to proceed. All questions were answered. )        Anesthesia Quick Evaluation

## 2019-10-07 LAB — CBC
HCT: 32.5 % — ABNORMAL LOW (ref 36.0–46.0)
Hemoglobin: 10.8 g/dL — ABNORMAL LOW (ref 12.0–15.0)
MCH: 28.2 pg (ref 26.0–34.0)
MCHC: 33.2 g/dL (ref 30.0–36.0)
MCV: 84.9 fL (ref 80.0–100.0)
Platelets: 170 10*3/uL (ref 150–400)
RBC: 3.83 MIL/uL — ABNORMAL LOW (ref 3.87–5.11)
RDW: 13.4 % (ref 11.5–15.5)
WBC: 9.8 10*3/uL (ref 4.0–10.5)
nRBC: 0 % (ref 0.0–0.2)

## 2019-10-07 MED ORDER — BENZOCAINE-MENTHOL 20-0.5 % EX AERO: 1.0000 "application " | INHALATION_SPRAY | CUTANEOUS | Status: AC | PRN

## 2019-10-07 MED ORDER — COCONUT OIL OIL: 1.0000 "application " | TOPICAL_OIL | 0 refills | Status: AC | PRN

## 2019-10-07 MED ORDER — ACETAMINOPHEN 500 MG PO TABS: 1000.0000 mg | ORAL_TABLET | Freq: Four times a day (QID) | ORAL | 2 refills | Status: AC | PRN

## 2019-10-07 MED ORDER — IBUPROFEN 600 MG PO TABS: 600.0000 mg | ORAL_TABLET | Freq: Four times a day (QID) | ORAL | 0 refills | Status: AC

## 2019-10-07 MED ORDER — SERTRALINE HCL 50 MG PO TABS: 50.0000 mg | ORAL_TABLET | Freq: Every day | ORAL | 2 refills | Status: AC

## 2019-10-07 NOTE — Discharge Instructions (Signed)
Lactation outpatient support - home visit ° ° °Sharena Coppola °RN, MHA, IBCLC °at [facility] ° °[email or web address] ° °

## 2019-10-07 NOTE — Progress Notes (Addendum)
Social work and provider notified of [place] and pt discharge. Social work will reach out to pt and provider following her closely. Will see pt in office.   Arlan Organ, CNM notified at [phone]

## 2019-10-07 NOTE — Discharge Summary (Signed)
OB Discharge Summary  Patient Name: Melissa Alexander DOB: Jul 15, 1990 MRN: [id number]  Date of admission: 10/06/2019 Delivering provider: Dorisann Frames K   Admitting diagnosis: Normal labor [O80, Z37.9] Intrauterine pregnancy: [redacted]w[redacted]d     Secondary diagnosis: Patient Active Problem List   Diagnosis Date Noted  . SVD (spontaneous vaginal delivery) 8/16 10/06/2019  . Second degree perineal laceration 10/06/2019  . Postpartum care following vaginal delivery 8/16 10/06/2019  . GAD (generalized anxiety disorder) 01/28/2019  . Overweight (BMI 25.0-29.9) 09/24/2018  . PCOS (polycystic ovarian syndrome) 09/24/2018   Additional problems:none   Date of discharge: 10/07/2019   Discharge diagnosis: Principal Problem:   Postpartum care following vaginal delivery 8/16 Active Problems:   GAD (generalized anxiety disorder)   SVD (spontaneous vaginal delivery) 8/16   Second degree perineal laceration                                                              Post partum procedures:none  Augmentation: Pitocin Pain control: Epidural  Laceration:2nd degree  Episiotomy:None  Complications: None  Hospital course:  Onset of Labor With Vaginal Delivery      29 y.o. yo [id number] at [redacted]w[redacted]d was admitted in Latent Labor on 10/06/2019. Patient had an uncomplicated labor course as follows:  Membrane Rupture Time/Date: 4:27 AM ,10/06/2019   Delivery Method:Vaginal, Spontaneous  Episiotomy: None  Lacerations:  2nd degree  Patient had an uncomplicated postpartum course.  She is ambulating, tolerating a regular diet, passing flatus, and urinating well. Patient is discharged home in stable condition on 10/07/19.  Newborn Data: Birth date:10/06/2019  Birth time:8:42 AM  Gender:Female  Living status:Living  Apgars:8 ,9  [id number] g   Physical exam  Vitals:   10/06/19 1548 10/06/19 2007 10/07/19 0031 10/07/19 0538  BP: 118/75 118/61 (!) 102/55 111/69  Pulse: 86 79 63 71  Resp: 18 18 16 16   Temp: 98.5 F  (36.9 C) 97.8 F (36.6 C) 98.2 F (36.8 C) 97.8 F (36.6 C)  TempSrc: Oral Oral Oral Axillary  SpO2:  100% 100% 99%  Weight:      Height:       General: alert, cooperative and no distress Lochia: appropriate Uterine Fundus: firm Incision: N/A Perineum: repair intact, no edema DVT Evaluation: No cords or calf tenderness. No significant calf/ankle edema. Labs: Lab Results  Component Value Date   WBC 9.8 10/07/2019   HGB 10.8 (L) 10/07/2019   HCT 32.5 (L) 10/07/2019   MCV 84.9 10/07/2019   PLT 170 10/07/2019   CMP Latest Ref Rng & Units 01/28/2019  Glucose 65 - 99 mg/dL 86  BUN 7 - 25 mg/dL 9  Creatinine 14/09/2018 - 9.89 mg/dL 2.11  Sodium 9.41 - 740 mmol/L 139  Potassium 3.5 - 5.3 mmol/L 3.8  Chloride 98 - 110 mmol/L 100  CO2 20 - 32 mmol/L 28  Calcium 8.6 - 10.2 mg/dL 9.8  Total Protein 6.1 - 8.1 g/dL 6.8  Total Bilirubin 0.2 - 1.2 mg/dL 0.7  Alkaline Phos 33 - 115 U/L -  AST 10 - 30 U/L 21  ALT 6 - 29 U/L 25   Edinburgh Postnatal Depression Scale Screening Tool 10/06/2019  I have been able to laugh and see the funny side of things. 1  I have looked forward with enjoyment to things. 2  I have blamed myself unnecessarily when things went wrong. 2  I have been anxious or worried for no good reason. 2  I have felt scared or panicky for no good reason. 2  Things have been getting on top of me. 2  I have been so unhappy that I have had difficulty sleeping. 2  I have felt sad or miserable. 2  I have been so unhappy that I have been crying. 1  The thought of harming myself has occurred to me. 0  Edinburgh Postnatal Depression Scale Total 16   Vaccines: TDaP UTD         Flu    NA  Discharge instruction:  per After Visit Summary,  [facility] booklet and  "Understanding Mother & Baby Care" hospital booklet  After Visit Meds:  Allergies as of 10/07/2019   No Known Allergies     Medication List    TAKE these medications   acetaminophen 500 MG tablet Commonly known  as: TYLENOL Take 2 tablets (1,000 mg total) by mouth every 6 (six) hours as needed.   benzocaine-Menthol 20-0.5 % Aero Commonly known as: DERMOPLAST Apply 1 application topically as needed for irritation (perineal discomfort).   coconut oil Oil Apply 1 application topically as needed.   ibuprofen 600 MG tablet Commonly known as: ADVIL Take 1 tablet (600 mg total) by mouth every 6 (six) hours.   metFORMIN 500 MG 24 hr tablet Commonly known as: GLUCOPHAGE-XR Take 500 mg by mouth daily with breakfast.   sertraline 50 MG tablet Commonly known as: Zoloft Take 1 tablet (50 mg total) by mouth daily.            Discharge Care Instructions  (From admission, onward)         Start     Ordered   10/07/19 0000  Discharge wound care:       Comments: Sitz baths 2 times /day with warm water x 1 week. May add herbals: 1 ounce dried comfrey leaf* 1 ounce calendula flowers 1 ounce lavender flowers  Supplies can be found online at [employer] sources at [employer], [employer]  1/2 ounce dried uva ursi leaves 1/2 ounce witch hazel blossoms (if you can find them) 1/2 ounce dried sage leaf 1/2 cup sea salt Directions: Bring 2 quarts of water to a boil. Turn off heat, and place 1 ounce (approximately 1 large handful) of the above mixed herbs (not the salt) into the pot. Steep, covered, for 30 minutes.  Strain the liquid well with a fine mesh strainer, and discard the herb material. Add 2 quarts of liquid to the tub, along with the 1/2 cup of salt. This medicinal liquid can also be made into compresses and peri-rinses.   10/07/19 1116          Diet: routine diet  Activity: Advance as tolerated. Pelvic rest for 6 weeks.   Postpartum contraception: IUD Used Mirena in the past, had sig. cramping and bleeding. Will plan for sono-guided insertion at 8 wks PP visit  Newborn Data: Live born female  Birth Weight: 7 lb 11.3 oz (3496 g) APGAR: 8, 9  Newborn Delivery    Birth date/time: 10/06/2019 08:42:00 Delivery type: Vaginal, Spontaneous      named Jean Rosenthal Baby Feeding: Breast Disposition:home with mother  Patient moving to [place] for spouse's job, desires to continue care at [facility] until [facility] course completed.  Delivery Report:   Review the Delivery Report for details.  Follow up:  Follow-up Information    [name], CNM. Schedule an appointment as soon as possible for a visit in 2 week(s).   Specialty: Certified Nurse Midwife Why: Virtual visit for mood and postpartum assessment.  Schedule 8 weeks postpartum visit in person for IUD placement under sono guidance. Contact information: [address] [place] [address] [phone]                 Signed: Cipriano Mile, MSN 10/07/2019, 11:16 AM

## 2022-07-20 ENCOUNTER — Emergency Department: Admit: 2022-07-20 | Payer: BLUE CROSS/BLUE SHIELD | Primary: Internal Medicine

## 2022-07-20 ENCOUNTER — Inpatient Hospital Stay
Admit: 2022-07-20 | Discharge: 2022-07-20 | Disposition: A | Discharge status: Home / Self Care | Payer: BLUE CROSS/BLUE SHIELD

## 2022-07-20 DIAGNOSIS — R079 Chest pain, unspecified: Principal | ICD-10-CM

## 2022-07-20 LAB — URINALYSIS
Bilirubin, Urine: NEGATIVE
Blood, Urine: NEGATIVE
Glucose, Ur: NEGATIVE mg/dL
Ketones, Urine: NEGATIVE mg/dL
Leukocyte Esterase, Urine: NEGATIVE
Nitrite, Urine: NEGATIVE
Protein, UA: NEGATIVE mg/dL
Specific Gravity, UA: 1.015 (ref 1.001–1.023)
Urobilinogen, Urine: 0.2 EU/dL (ref 0.2–1.0)
pH, Urine: 7 (ref 5.0–9.0)

## 2022-07-20 LAB — CBC WITH AUTO DIFFERENTIAL
Basophils %: 1 % (ref 0.0–2.0)
Basophils Absolute: 0.1 10*3/uL (ref 0.0–0.2)
Eosinophils %: 1 % (ref 0.5–7.8)
Eosinophils Absolute: 0.1 10*3/uL (ref 0.0–0.8)
Hematocrit: 37.5 % (ref 35.8–46.3)
Hemoglobin: 12.9 g/dL (ref 11.7–15.4)
Immature Granulocytes %: 0 % (ref 0.0–5.0)
Immature Granulocytes Absolute: 0 10*3/uL (ref 0.0–0.5)
Lymphocytes %: 39 % (ref 13–44)
Lymphocytes Absolute: 2.4 10*3/uL (ref 0.5–4.6)
MCH: 28.9 PG (ref 26.1–32.9)
MCHC: 34.4 g/dL (ref 31.4–35.0)
MCV: 84.1 FL (ref 82.0–102.0)
MPV: 9 FL — ABNORMAL LOW (ref 9.4–12.3)
Monocytes %: 12 % (ref 4.0–12.0)
Monocytes Absolute: 0.7 10*3/uL (ref 0.1–1.3)
Neutrophils %: 48 % (ref 43–78)
Neutrophils Absolute: 2.9 10*3/uL (ref 1.7–8.2)
Platelets: 236 10*3/uL (ref 150–450)
RBC: 4.46 M/uL (ref 4.05–5.20)
RDW: 11.9 % (ref 11.9–14.6)
WBC: 6.1 10*3/uL (ref 4.3–11.1)
nRBC: 0 10*3/uL (ref 0.0–0.2)

## 2022-07-20 LAB — TROPONIN
Troponin T: <6 ng/L (ref 0–14)
Troponin T: <6 ng/L (ref 0–14)

## 2022-07-20 LAB — BASIC METABOLIC PANEL
Anion Gap: 12 mmol/L — ABNORMAL HIGH (ref 2–11)
BUN: 15 MG/DL (ref 6–23)
CO2: 26 mmol/L (ref 21–32)
Calcium: 9.5 MG/DL (ref 8.3–10.4)
Chloride: 104 mmol/L (ref 98–107)
Creatinine: 0.58 MG/DL — ABNORMAL LOW (ref 0.6–1.0)
Est, Glom Filt Rate: >90 mL/min/{1.73_m2} (ref >60)
Glucose: 87 mg/dL (ref 65–100)
Potassium: 3.9 mmol/L (ref 3.5–5.1)
Sodium: 142 mmol/L (ref 133–143)

## 2022-07-20 LAB — MAGNESIUM: Magnesium: 1.8 mg/dL (ref 1.2–2.6)

## 2022-07-20 LAB — PREGNANCY, URINE: Pregnancy, Urine: NEGATIVE

## 2022-07-20 LAB — D-DIMER, QUANTITATIVE: D-Dimer, Quant: 0.28 ug/ml(FEU) (ref <0.56)

## 2022-07-20 NOTE — ED Triage Notes (Signed)
Pt coming in for palpitations with chest tightness and SOB over the last week. Pt denies any N/V/D or radiating pain. Pt has stated she has been constipated since Monday. Pt states no cardiac history for herself but cardiac problems do run in the family. Pt states pain and SOB increase with activity.

## 2022-07-20 NOTE — Discharge Instructions (Signed)
Follow-up with recommended provider in the next 1-2 days.  Return to the ED immediately for any new, worsening, concerning symptoms; or for danger signs as discussed.

## 2022-07-20 NOTE — ED Provider Notes (Signed)
Emergency Department Provider Note       PCP: No primary care provider on file.   Age: 32 y.o.   Sex: female     DISPOSITION         ICD-10-CM    1. Chest pain, unspecified type  R07.9 [facility] - [facility] [place]      2. Shortness of breath  R06.02 [facility] - [facility] [address]      3. Palpitations  R00.2 [facility] - [facility] Cardiology [facility] Decision Making     As in HPI.  Well-appearing 32 year old female in ED with complaint of intermittent palpitations, chest pain and shortness of breath times 2+ weeks.  Denying any complaint at this time. Denies cardiac history, history of lung disease, injury activity change, history of PE or DVT, concern for pregnancy or UTI.  Denies cough or hemoptysis.  Has Mirena.  Endorse history of depression, PCOS, also states she typically "on the spectrum" of constipation.  Denies abdominal pain, flank pain or back pain.  Denies black or bloody stools.  Last bowel movement 3 days ago.  Lungs are clear, no reproducible chest pain.  Has a reassuring EKG showing sinus rhythm with no ischemic change or ectopic beat nor arrhythmia.   I have low clinical suspicion at this time for ACS, life-threatening arrhythmia, PE or DVT, pneumonia, pneumothorax, ectopic pregnancy, pyelonephritis, dissection, SBO or other acute emergent process.  Will obtain chest x-ray, EKG, troponin, D-dimer, CBC, CMP, urinalysis, urine hCG.  Patient resting comfortably in the ED.  Has had no return of symptoms.  Abdomen is soft nontender, lungs are clear.  With negative D-dimer, negative troponin x 2, no ischemic change or concerning arrhythmia noted on EKG.  Negative urine hCG, no UTI, labs otherwise reassuring.  Discussed with them.  Feel stable for discharge home.  Will refer to cardiology.  Follow-up with PCP tomorrow.  Discussed therapeutic measures, return precautions     1 or more acute illnesses that pose a threat to life or bodily function.   Patient was discharged risks and  benefits of hospitalization were considered.  The following clinical decision tools were used in the care of this patient HEART score  PERC criteria.    I independently ordered and reviewed each unique test.  I reviewed external records: provider visit note from PCP.     I interpreted the chest x-ray with no acute process noted as read by radiologist.  EKG: Rhythm: Sinus.  Rate: 77.  Interpretation: No STEMI            History     HPI  Well-appearing 32 year old female presents to the ED with complaint of intermittent chest tightness/heaviness, shortness of breath and palpitations x 2 weeks.  States that she has been experiencing these episodes 5-10 times per day for the last 14+ days.  States they typically last approximately 5-10 minutes per event.  She states she knows nothing that makes symptoms worse or better, nothing to relieve or provoke.  Denies exertional component, cough or hemoptysis.  Denies cardiac history, family history of early cardiac death, history of PE or DVT.  States she called her PCP to discuss this problem and they advised her to come to come to the ED.  Denies all other complaint.  Denies abdominal pain, nausea or vomiting, black or bloody stools, change in stool shape or color.  Nontoxic in appearance and appears no distress  ROS  Review of Systems   As in HPI  Physical Exam     Vitals signs and nursing note reviewed:  Vitals:    07/20/22 1630 07/20/22 1645 07/20/22 1700 07/20/22 1715   BP: 96/61 (!) 105/58 114/66 (!) 110/55   Pulse: 82 79 74 74   Resp: 21 22 14 23    Temp:       TempSrc:       SpO2: 100% 99% 97% 97%   Weight:          Physical Exam   Constitutional: Oriented to person, place, and time. Appears well-developed and well-nourished. No distress.    HENT:    Head: Normocephalic and atraumatic   Right Ear: External ear normal.    Left Ear: External ear normal.     Nose: Nose normal.   Mouth/Throat: Mouth normal.    Eyes: Conjunctivae are normal.   Neck: Supple. No tracheal  deviation.   Cardiovascular: Normal rate, intact distal pulses. Brisk capillary refill intact, less than 2 seconds. Regular rhythm present. No pitting edema.  No calf tenderness, palpable cords or unilateral leg swelling.  Pulmonary/Chest: Lungs are clear & equal bilaterally.  No chest wall tenderness.  No diminished sounds.  No adventitious sounds. No respiratory distress.    Abdominal: Soft. There is no tenderness.  No guarding rebound rigidity.  No flank or CVA tenderness.  Normoactive bowel sounds  Musculoskeletal: No obvious deformity, erythema, edema.   Neurological: Alert and oriented to person, place, and time. No numbness/tingling. No loss of sensation. Positive PMS 4. GCS= 15.    Skin: Skin is warm and dry. Capillary refill takes less than 2 seconds. No abrasion, no lesion, no petechiae and no rash noted. Not diaphoretic. No cyanosis, erythema, or pallor.    Psychiatric: Normal mood and affect. Behavior is normal.    Nursing note and vitals reviewed.     Procedures     Procedures    Orders Placed This Encounter   Procedures    XR CHEST PORTABLE    Basic Metabolic Panel    CBC with Auto Differential    Troponin    Magnesium    Urine Preg (Lab)    Urinalysis    Cardiac Monitor - ED Only    Pulse oximetry, continuous    EKG 12 Lead    Saline lock IV        Medications given during this emergency department visit:  Medications - No data to display    New Prescriptions    No medications on file        No past medical history on file.     No past surgical history on file.     Social History     Socioeconomic History    Marital status: Single        Previous Medications    No medications on file        Results for orders placed or performed during the hospital encounter of 07/20/22   XR CHEST PORTABLE    Narrative    Chest X-ray    INDICATION: Chest pain    COMPARISON:  None    TECHNIQUE: Frontal view of the chest was obtained.    FINDINGS: The lungs are clear. There are no infiltrates or effusions.  The  heart  size is normal.  The bony thorax is intact.        Impression    No acute findings in the chest     Basic  Metabolic Panel   Result Value Ref Range    Sodium 142 133 - 143 mmol/L    Potassium 3.9 3.5 - 5.1 mmol/L    Chloride 104 98 - 107 mmol/L    CO2 26 21 - 32 mmol/L    Anion Gap 12 (H) 2 - 11 mmol/L    Glucose 87 65 - 100 mg/dL    BUN 15 6 - 23 MG/DL    Creatinine 1.61 (L) 0.6 - 1.0 MG/DL    Est, Glom Filt Rate >90 >60 ml/min/1.62m2    Calcium 9.5 8.3 - 10.4 MG/DL   CBC with Auto Differential   Result Value Ref Range    WBC 6.1 4.3 - 11.1 K/uL    RBC 4.46 4.05 - 5.20 M/uL    Hemoglobin 12.9 11.7 - 15.4 g/dL    Hematocrit 09.6 04.5 - 46.3 %    MCV 84.1 82.0 - 102.0 FL    MCH 28.9 26.1 - 32.9 PG    MCHC 34.4 31.4 - 35.0 g/dL    RDW 40.9 81.1 - 91.4 %    Platelets 236 150 - 450 K/uL    MPV 9.0 (L) 9.4 - 12.3 FL    nRBC 0.00 0.0 - 0.2 K/uL    Differential Type AUTOMATED      Neutrophils % 48 43 - 78 %    Lymphocytes % 39 13 - 44 %    Monocytes % 12 4.0 - 12.0 %    Eosinophils % 1 0.5 - 7.8 %    Basophils % 1 0.0 - 2.0 %    Immature Granulocytes % 0 0.0 - 5.0 %    Neutrophils Absolute 2.9 1.7 - 8.2 K/UL    Lymphocytes Absolute 2.4 0.5 - 4.6 K/UL    Monocytes Absolute 0.7 0.1 - 1.3 K/UL    Eosinophils Absolute 0.1 0.0 - 0.8 K/UL    Basophils Absolute 0.1 0.0 - 0.2 K/UL    Immature Granulocytes Absolute 0.0 0.0 - 0.5 K/UL   Troponin   Result Value Ref Range    Troponin T <6.0 0 - 14 ng/L   Magnesium   Result Value Ref Range    Magnesium 1.8 1.2 - 2.6 mg/dL   Urine Preg (Lab)   Result Value Ref Range    Pregnancy, Urine Negative     Urinalysis   Result Value Ref Range    Color, UA YELLOW      Appearance CLEAR      Specific Gravity, UA 1.015 1.001 - 1.023      pH, Urine 7.0 5.0 - 9.0      Protein, UA Negative NEG mg/dL    Glucose, Ur Negative mg/dL    Ketones, Urine Negative NEG mg/dL    Bilirubin, Urine Negative NEG      Blood, Urine Negative NEG      Urobilinogen, Urine 0.2 0.2 - 1.0 EU/dL    Nitrite, Urine  Negative NEG      Leukocyte Esterase, Urine Negative NEG     D-Dimer, Quantitative   Result Value Ref Range    D-Dimer, Quant 0.28 <0.56 ug/ml(FEU)   Troponin   Result Value Ref Range    Troponin T <6.0 0 - 14 ng/L         XR CHEST PORTABLE   Final Result   No acute findings in the chest                      No  results for input(s): "COVID19" in the last 72 hours.    Voice dictation software was used during the making of this note.  This software is not perfect and grammatical and other typographical errors may be present.  This note has not been completely proofread for errors.     Lyda Kalata, APRN - CNP  07/20/22 2208

## 2022-07-20 NOTE — ED Notes (Signed)
Patient mobility status  with no difficulty. Provider aware     I have reviewed discharge instructions with the patient.  The patient verbalized understanding.    Patient left ED via Discharge Method: ambulatory to Home with  self .    Opportunity for questions and clarification provided.     Patient given 0 scripts.

## 2022-07-21 LAB — EKG 12-LEAD
Atrial Rate: 75 {beats}/min
P Axis: 40 degrees
P-R Interval: 108 ms
Q-T Interval: 376 ms
QRS Duration: 82 ms
QTc Calculation (Bazett): 426 ms
R Axis: 72 degrees
T Axis: 62 degrees
Ventricular Rate: 77 {beats}/min

## 2022-07-27 ENCOUNTER — Ambulatory Visit: Admit: 2022-07-27 | Payer: BLUE CROSS/BLUE SHIELD | Primary: Internal Medicine

## 2022-07-27 ENCOUNTER — Encounter
Admit: 2022-07-27 | Discharge: 2022-07-27 | Payer: BLUE CROSS/BLUE SHIELD | Attending: Cardiovascular Disease | Primary: Internal Medicine

## 2022-07-27 DIAGNOSIS — R002 Palpitations: Secondary | ICD-10-CM

## 2022-07-27 NOTE — Progress Notes (Signed)
[facility] History & Physical                 Reason for Visit:Shortness of breath and palpitations    Subjective:     Patient is a 32 y.o. female who presents as a referral for shortness of breath and palpitations.  The patient visited the ER on May 30 for palpitations and SOB.  She denies chest pain.  D-dimer was normal.  Troponin was undetectably low x 2.  The patient reports that she "could not catch my breath" when she experienced palpitations.  She reports that her heart rate "feels fast" and reports a tachycardia sensation every day.    No past medical history on file.   No past surgical history on file.   No family history on file.   Social History     Tobacco Use    Smoking status: Not on file    Smokeless tobacco: Not on file   Substance Use Topics    Alcohol use: Not on file      No Known Allergies      ROS:  No obvious pertinent positives on review of systems except for what was outlined above.       Objective:       BP 118/82   Pulse 72   Ht 1.626 m (5\' 4" )   Wt 67.6 kg (149 lb)   BMI 25.58 kg/m     BP Readings from Last 3 Encounters:   07/27/22 118/82   07/20/22 (!) 110/55       Wt Readings from Last 3 Encounters:   07/27/22 67.6 kg (149 lb)   07/20/22 68 kg (150 lb)       General/Constitutional:   Alert and oriented x 3, no acute distress  HEENT:   normocephalic, atraumatic, moist mucous membranes  Neck:   No JVD or carotid bruits bilaterally  Cardiovascular:   regular rate and rhythm, no rub/gallop appreciated  Pulmonary:   clear to auscultation bilaterally, no respiratory distress  Abdomen:   soft, non-tender, non-distended  Ext:   No sig LE edema bilaterally  Skin:  warm and dry, no obvious rashes seen  Neuro:   no obvious sensory or motor deficits  Psychiatric:   normal mood and affect    Data Review:   No results found for: "CHOL"  No results found for: "TRIG"  No results found for: "HDL"  No components found for: "LDLCHOLESTEROL", "LDLCALC"  No results found for: "VLDL"  No results  found for: "CHOLHDLRATIO"     Lab Results   Component Value Date/Time    NA 142 07/20/2022 02:59 PM    K 3.9 07/20/2022 02:59 PM    CL 104 07/20/2022 02:59 PM    CO2 26 07/20/2022 02:59 PM    BUN 15 07/20/2022 02:59 PM    CREATININE 0.58 07/20/2022 02:59 PM    GLUCOSE 87 07/20/2022 02:59 PM    CALCIUM 9.5 07/20/2022 02:59 PM         No results found for: "ALT", "AST"     Assessment/Plan:   1. Palpitations  - Obtain a ZIO for 3 days     2. Dyspnea, unspecified type  - Obtain an echocardiogram   - PE unlikely per PE Wells score    F/U: As needed    Jillyn Hidden, MD

## 2022-08-01 LAB — EXTENDED CARDIAC HOLTER MONITOR: Body Surface Area: 1.75 m2

## 2022-08-01 NOTE — Telephone Encounter (Signed)
Pt.notified of MD response and v/u.

## 2022-08-01 NOTE — Telephone Encounter (Signed)
-----   Message from Jillyn Hidden, MD sent at 08/01/2022  8:53 AM EDT -----  Please let the patient know that the patient was predominantly in sinus rhythm.  The patient had rare ectopy.  No new changes to medical therapy at this time.

## 2022-08-11 LAB — HEMOGLOBIN A1C
Estimated Avg Glucose, External: 94 mg/dL
Hemoglobin A1C, External: 4.9 % (ref 4.8–5.6)

## 2022-08-30 ENCOUNTER — Ambulatory Visit: Admit: 2022-08-30 | Discharge: 2022-08-30 | Payer: BLUE CROSS/BLUE SHIELD | Primary: Internal Medicine

## 2022-08-30 DIAGNOSIS — R0600 Dyspnea, unspecified: Secondary | ICD-10-CM

## 2022-08-31 LAB — ECHO (TTE) COMPLETE (PRN CONTRAST/BUBBLE/STRAIN/3D)
AV Area by VTI: 2.9 cm2
AV Mean Gradient: 4 mmHg
AV Mean Velocity: 0.9 m/s
AV VTI: 24.8 cm
AVA/BSA VTI: 1.7 cm2/m2
Ao Root Index: 1.45 cm/m2
Aortic Root: 2.5 cm
Body Surface Area: 1.75 m2
E/E' Lateral: 5.31
E/E' Ratio (Averaged): 5.53
E/E' Septal: 5.75
EF BP: 60 % (ref 55–100)
Est. RA Pressure: 3 mmHg
Fractional Shortening 2D: 33 % (ref 28–44)
IVSd: 0.8 cm (ref 0.6–0.9)
LA Area 2C: 16.1 cm2
LA Area 4C: 14.5 cm2
LA Diameter: 3.1 cm
LA Major Axis: 4.3 cm
LA Minor Axis: 4.9 cm
LA Size Index: 1.79 cm/m2
LA Volume BP: 44 mL (ref 22–52)
LA Volume Index BP: 25 ml/m2 (ref 16–34)
LA Volume Index MOD A2C: 25 ml/m2 (ref 16–34)
LA Volume Index MOD A4C: 23 ml/m2 (ref 16–34)
LA Volume MOD A2C: 43 mL (ref 22–52)
LA Volume MOD A4C: 39 mL (ref 22–52)
LA/AO Root Ratio: 1.24
LV E' Lateral Velocity: 13 cm/s
LV E' Septal Velocity: 12 cm/s
LV EDV A2C: 57 mL
LV EDV A4C: 57 mL
LV EDV Index A2C: 33 mL/m2
LV EDV Index A4C: 33 mL/m2
LV ESV A2C: 21 mL
LV ESV A4C: 24 mL
LV ESV Index A2C: 12 mL/m2
LV ESV Index A4C: 14 mL/m2
LV Ejection Fraction A2C: 63 %
LV Ejection Fraction A4C: 58 %
LV Mass 2D Index: 75.8 g/m2 (ref 43–95)
LV Mass 2D: 131.2 g (ref 67–162)
LV RWT Ratio: 0.33
LVIDd Index: 2.83 cm/m2
LVIDd: 4.9 cm (ref 3.9–5.3)
LVIDs Index: 1.91 cm/m2
LVIDs: 3.3 cm
LVOT Area: 3.1 cm2
LVOT Diameter: 2 cm
LVOT Mean Gradient: 3 mmHg
LVOT SV: 72.2 ml
LVOT Stroke Volume Index: 41.7 mL/m2
LVOT VTI: 23 cm
LVOT:AV VTI Index: 0.93
LVPWd: 0.8 cm (ref 0.6–0.9)
MV A Velocity: 0.34 m/s
MV E Velocity: 0.69 m/s
MV E Wave Deceleration Time: 180 ms
MV E/A: 2.03
RA Major Axis Index: 1.5 cm/m2
RA Major Axis: 2.6 cm
RV Basal Dimension: 2.6 cm
RV Mid Dimension: 1.8 cm
RVIDd: 3.2 cm
RVSP: 26 mmHg
TAPSE: 2.3 cm (ref 1.7–?)
TR Max Velocity: 2.39 m/s
TR Peak Gradient: 23 mmHg

## 2022-08-31 NOTE — Telephone Encounter (Signed)
-----   Message from Jillyn Hidden, MD sent at 08/31/2022  1:44 PM EDT -----  Please let the patient know that the heart function is normal on ECHO.  The patient does have mild mitral regurgitation documented on the echocardiogram.  Therefore, this warrants a surveillance echocardiogram in 3 to 5 years.  Mild mitral regurgitation is a common finding on echocardiography and does not account for the patient's symptoms.  I recommend the future echocardiogram be ordered by the patient's PCP's office at that time, and the patient can follow-up with [place] if needed if there is progression.  A message has been sent to the patient's PCP with the recommendation.

## 2022-08-31 NOTE — Telephone Encounter (Signed)
Patient was notified of Dr Hillard Danker response to her Echocardiogram. I told her Dr Freddi Starr recommended she follow up with PCP in the future to order surveillance Echo  in 3-5byrs. I also told her she can follow up with [place] if she has any progression of symptoms. Pt agreed.
